# Patient Record
Sex: Male | Born: 1950 | Race: Black or African American | Hispanic: No | Marital: Married | Smoking: Never smoker
Health system: Southern US, Community
[De-identification: ages and names within clinical notes are randomized; demographics above are authoritative.]

## PROBLEM LIST (undated history)

## (undated) DIAGNOSIS — H3552 Pigmentary retinal dystrophy: Secondary | ICD-10-CM

## (undated) DIAGNOSIS — K429 Umbilical hernia without obstruction or gangrene: Secondary | ICD-10-CM

## (undated) DIAGNOSIS — G4733 Obstructive sleep apnea (adult) (pediatric): Secondary | ICD-10-CM

## (undated) DIAGNOSIS — K635 Polyp of colon: Secondary | ICD-10-CM

## (undated) DIAGNOSIS — K219 Gastro-esophageal reflux disease without esophagitis: Secondary | ICD-10-CM

## (undated) DIAGNOSIS — G4730 Sleep apnea, unspecified: Secondary | ICD-10-CM

## (undated) DIAGNOSIS — I209 Angina pectoris, unspecified: Secondary | ICD-10-CM

## (undated) DIAGNOSIS — E785 Hyperlipidemia, unspecified: Secondary | ICD-10-CM

## (undated) DIAGNOSIS — I739 Peripheral vascular disease, unspecified: Secondary | ICD-10-CM

## (undated) DIAGNOSIS — I10 Essential (primary) hypertension: Secondary | ICD-10-CM

## (undated) DIAGNOSIS — I714 Abdominal aortic aneurysm, without rupture, unspecified: Secondary | ICD-10-CM

## (undated) HISTORY — DX: Hyperlipidemia, unspecified: E78.5

## (undated) HISTORY — PX: KNEE SURGERY: SHX244

## (undated) HISTORY — PX: COLONOSCOPY: SHX174

## (undated) HISTORY — PX: OTHER SURGICAL HISTORY: SHX169

## (undated) HISTORY — PX: ACHILLES TENDON SURGERY: SHX542

## (undated) HISTORY — PX: HERNIA REPAIR: SHX51

## (undated) HISTORY — PX: EYE SURGERY: SHX253

---

## 1995-06-27 DIAGNOSIS — H3552 Pigmentary retinal dystrophy: Secondary | ICD-10-CM

## 1995-06-27 HISTORY — DX: Pigmentary retinal dystrophy: H35.52

## 2012-09-17 DIAGNOSIS — H269 Unspecified cataract: Secondary | ICD-10-CM | POA: Insufficient documentation

## 2013-09-29 ENCOUNTER — Emergency Department: Payer: Self-pay | Admitting: Emergency Medicine

## 2013-09-29 LAB — COMPREHENSIVE METABOLIC PANEL
ALK PHOS: 104 U/L
ALT: 34 U/L (ref 12–78)
Albumin: 3.7 g/dL (ref 3.4–5.0)
Anion Gap: 5 — ABNORMAL LOW (ref 7–16)
BUN: 15 mg/dL (ref 7–18)
Bilirubin,Total: 0.5 mg/dL (ref 0.2–1.0)
Calcium, Total: 8.1 mg/dL — ABNORMAL LOW (ref 8.5–10.1)
Chloride: 102 mmol/L (ref 98–107)
Co2: 30 mmol/L (ref 21–32)
Creatinine: 2.21 mg/dL — ABNORMAL HIGH (ref 0.60–1.30)
EGFR (African American): 35 — ABNORMAL LOW
EGFR (Non-African Amer.): 30 — ABNORMAL LOW
GLUCOSE: 108 mg/dL — AB (ref 65–99)
Osmolality: 275 (ref 275–301)
POTASSIUM: 3.7 mmol/L (ref 3.5–5.1)
SGOT(AST): 41 U/L — ABNORMAL HIGH (ref 15–37)
Sodium: 137 mmol/L (ref 136–145)
Total Protein: 7.7 g/dL (ref 6.4–8.2)

## 2013-09-29 LAB — CBC
HCT: 43.7 % (ref 40.0–52.0)
HGB: 14.4 g/dL (ref 13.0–18.0)
MCH: 31 pg (ref 26.0–34.0)
MCHC: 32.8 g/dL (ref 32.0–36.0)
MCV: 95 fL (ref 80–100)
Platelet: 175 10*3/uL (ref 150–440)
RBC: 4.63 10*6/uL (ref 4.40–5.90)
RDW: 13.5 % (ref 11.5–14.5)
WBC: 4.2 10*3/uL (ref 3.8–10.6)

## 2013-09-29 LAB — URINALYSIS, COMPLETE
BACTERIA: NONE SEEN
BLOOD: NEGATIVE
Bilirubin,UR: NEGATIVE
Glucose,UR: NEGATIVE mg/dL (ref 0–75)
Ketone: NEGATIVE
LEUKOCYTE ESTERASE: NEGATIVE
Nitrite: NEGATIVE
Ph: 5 (ref 4.5–8.0)
Protein: 30
RBC,UR: NONE SEEN /HPF (ref 0–5)
SPECIFIC GRAVITY: 1.028 (ref 1.003–1.030)
Squamous Epithelial: <1

## 2013-09-29 LAB — LIPASE, BLOOD: Lipase: 86 U/L (ref 73–393)

## 2013-09-29 LAB — MAGNESIUM: Magnesium: 1.8 mg/dL

## 2014-04-26 DIAGNOSIS — I10 Essential (primary) hypertension: Secondary | ICD-10-CM | POA: Insufficient documentation

## 2014-05-14 DIAGNOSIS — E7800 Pure hypercholesterolemia, unspecified: Secondary | ICD-10-CM | POA: Insufficient documentation

## 2016-03-21 DIAGNOSIS — Z0000 Encounter for general adult medical examination without abnormal findings: Secondary | ICD-10-CM | POA: Insufficient documentation

## 2016-03-28 ENCOUNTER — Encounter: Payer: Self-pay | Admitting: Internal Medicine

## 2016-03-28 ENCOUNTER — Ambulatory Visit (INDEPENDENT_AMBULATORY_CARE_PROVIDER_SITE_OTHER): Payer: Medicare Other | Admitting: Internal Medicine

## 2016-03-28 VITALS — BP 145/94 | HR 58 | Ht 76.0 in | Wt 223.0 lb

## 2016-03-28 DIAGNOSIS — R55 Syncope and collapse: Secondary | ICD-10-CM | POA: Diagnosis not present

## 2016-03-28 NOTE — Progress Notes (Signed)
HPI Joe Pena is referred by Dr. Ouida Sills for evaluation of syncope. He is a pleasant otherwise healthy 65 yo man with dyslipidemia. He presented initially with syncope several weeks ago. He notes that the spell occurred after he had been outside in the heat for about 4 hours. He had not eaten that day or had anything to drink. The patient began to feel bad and passed out. No loss of bowel or bladder function. No tongue biting. His initial blood pressure was low and he was diaphoretic after the episode. His blood pressure was in the 80's. He felt bad for a day after. The next episode occurred last week. He was visiting a friend and had been working outside and again passed out. His friend caught him. He was out for about 45 seconds. Again he was nauseated, diaphoretic and evantually felt better. His blood pressure was 74/39. He has had no additional episodes.  Not on File   Current Outpatient Prescriptions  Medication Sig Dispense Refill  . atorvastatin (LIPITOR) 40 MG tablet Take 1 tablet by mouth daily.    . fluticasone (FLONASE) 50 MCG/ACT nasal spray Place 1-2 sprays into the nose as needed for allergies.     No current facility-administered medications for this visit.      No past medical history on file.  ROS:   All systems reviewed and negative except as noted in the HPI.   No past surgical history on file.   Family History  Problem Relation Age of Onset  . Heart disease Mother   . Hyperlipidemia Father      Social History   Social History  . Marital status: Married    Spouse name: N/A  . Number of children: N/A  . Years of education: N/A   Occupational History  . Not on file.   Social History Main Topics  . Smoking status: Never Smoker  . Smokeless tobacco: Never Used  . Alcohol use Yes     Comment: 2 times weekly  . Drug use: No  . Sexual activity: Not on file   Other Topics Concern  . Not on file   Social History Narrative  . No narrative on  file     BP (!) 145/94   Pulse (!) 58   Ht 6\' 4"  (1.93 m)   Wt 223 lb (101.2 kg)   SpO2 97%   BMI 27.14 kg/m   Physical Exam:  Well appearing NAD HEENT: Unremarkable Neck:  No JVD, no thyromegally Lymphatics:  No adenopathy Back:  No CVA tenderness Lungs:  Clear with no wheezes HEART:  Regular rate rhythm, no murmurs, no rubs, no clicks Abd:  soft, positive bowel sounds, no organomegally, no rebound, no guarding Ext:  2 plus pulses, no edema, no cyanosis, no clubbing Skin:  No rashes no nodules Neuro:  CN II through XII intact, motor grossly intact  EKG - nsr  Orthostatic vitals - no orthostasis.   Assess/Plan: 1. Syncope - his echo shows normal LV function and his symptoms consistent with autonomic dysfunction. I have discussed the treatment options with the patient. He is encouraged to increase his salt and fluid intake and to avoid caffeine and overheating.  I spent 45 minutes with the patient and his wife discussing the treatment options. He is enocuraged to avoid dehydration, caffeine, missing meals and I have asked him to eat more salt. I will see him back in several weeks. He is encouraged to lie down when he  feels an episode beginning to occur.  Joe Overlie Tredarius Cobern,MD

## 2016-03-28 NOTE — Patient Instructions (Addendum)
Medication Instructions:  Your physician recommends that you continue on your current medications as directed. Please refer to the Current Medication list given to you today.   Labwork: None ordered   Testing/Procedures: None ordered   Follow-Up:  Your physician recommends that you schedule a follow-up appointment in: 3 months with Dr Lovena Le   Any Other Special Instructions Will Be Listed Below (If Applicable).   Increase Salt and fluid in diet

## 2016-03-30 NOTE — Addendum Note (Signed)
Addended by: Campbell Riches on: 03/30/2016 09:40 AM   Modules accepted: Orders

## 2016-05-25 ENCOUNTER — Other Ambulatory Visit: Payer: Self-pay | Admitting: Internal Medicine

## 2016-05-25 DIAGNOSIS — R1904 Left lower quadrant abdominal swelling, mass and lump: Secondary | ICD-10-CM

## 2016-06-06 ENCOUNTER — Ambulatory Visit
Admission: RE | Admit: 2016-06-06 | Discharge: 2016-06-06 | Disposition: A | Discharge status: Home / Self Care | Payer: Medicare Other | Source: Ambulatory Visit | Attending: Internal Medicine | Admitting: Internal Medicine

## 2016-06-06 ENCOUNTER — Encounter: Payer: Self-pay | Admitting: Vascular Surgery

## 2016-06-06 ENCOUNTER — Ambulatory Visit (INDEPENDENT_AMBULATORY_CARE_PROVIDER_SITE_OTHER): Payer: Medicare Other | Admitting: Vascular Surgery

## 2016-06-06 ENCOUNTER — Inpatient Hospital Stay
Admission: AD | Admit: 2016-06-06 | Discharge: 2016-06-08 | DRG: 269 | Disposition: A | Discharge status: Home / Self Care | Payer: Medicare Other | Source: Ambulatory Visit | Attending: Vascular Surgery | Admitting: Vascular Surgery

## 2016-06-06 ENCOUNTER — Inpatient Hospital Stay: Payer: Medicare Other

## 2016-06-06 ENCOUNTER — Encounter (INDEPENDENT_AMBULATORY_CARE_PROVIDER_SITE_OTHER): Payer: Self-pay | Admitting: Vascular Surgery

## 2016-06-06 VITALS — BP 146/100 | HR 73 | Resp 17 | Ht 76.0 in | Wt 224.0 lb

## 2016-06-06 DIAGNOSIS — Z9889 Other specified postprocedural states: Secondary | ICD-10-CM

## 2016-06-06 DIAGNOSIS — R1904 Left lower quadrant abdominal swelling, mass and lump: Secondary | ICD-10-CM | POA: Insufficient documentation

## 2016-06-06 DIAGNOSIS — Z79899 Other long term (current) drug therapy: Secondary | ICD-10-CM

## 2016-06-06 DIAGNOSIS — E785 Hyperlipidemia, unspecified: Secondary | ICD-10-CM | POA: Diagnosis present

## 2016-06-06 DIAGNOSIS — I10 Essential (primary) hypertension: Secondary | ICD-10-CM

## 2016-06-06 DIAGNOSIS — K429 Umbilical hernia without obstruction or gangrene: Secondary | ICD-10-CM

## 2016-06-06 DIAGNOSIS — I714 Abdominal aortic aneurysm, without rupture, unspecified: Secondary | ICD-10-CM | POA: Diagnosis present

## 2016-06-06 DIAGNOSIS — E7800 Pure hypercholesterolemia, unspecified: Secondary | ICD-10-CM | POA: Diagnosis not present

## 2016-06-06 DIAGNOSIS — Z95828 Presence of other vascular implants and grafts: Secondary | ICD-10-CM | POA: Insufficient documentation

## 2016-06-06 DIAGNOSIS — Z01818 Encounter for other preprocedural examination: Secondary | ICD-10-CM

## 2016-06-06 DIAGNOSIS — N281 Cyst of kidney, acquired: Secondary | ICD-10-CM | POA: Insufficient documentation

## 2016-06-06 HISTORY — DX: Pigmentary retinal dystrophy: H35.52

## 2016-06-06 HISTORY — DX: Abdominal aortic aneurysm, without rupture: I71.4

## 2016-06-06 HISTORY — DX: Abdominal aortic aneurysm, without rupture, unspecified: I71.40

## 2016-06-06 HISTORY — DX: Essential (primary) hypertension: I10

## 2016-06-06 LAB — BASIC METABOLIC PANEL
ANION GAP: 4 — AB (ref 5–15)
BUN: 15 mg/dL (ref 6–20)
CO2: 29 mmol/L (ref 22–32)
Calcium: 8.7 mg/dL — ABNORMAL LOW (ref 8.9–10.3)
Chloride: 104 mmol/L (ref 101–111)
Creatinine, Ser: 1.21 mg/dL (ref 0.61–1.24)
GFR calc Af Amer: >60 mL/min (ref >60)
GLUCOSE: 98 mg/dL (ref 65–99)
POTASSIUM: 3.8 mmol/L (ref 3.5–5.1)
Sodium: 137 mmol/L (ref 135–145)

## 2016-06-06 LAB — CBC WITH DIFFERENTIAL/PLATELET
BASOS ABS: 0 10*3/uL (ref 0–0.1)
Basophils Relative: 1 %
Eosinophils Absolute: 0.2 10*3/uL (ref 0–0.7)
Eosinophils Relative: 3 %
HEMATOCRIT: 40.2 % (ref 40.0–52.0)
HEMOGLOBIN: 13.9 g/dL (ref 13.0–18.0)
LYMPHS PCT: 26 %
Lymphs Abs: 1.5 10*3/uL (ref 1.0–3.6)
MCH: 31.7 pg (ref 26.0–34.0)
MCHC: 34.5 g/dL (ref 32.0–36.0)
MCV: 91.6 fL (ref 80.0–100.0)
MONO ABS: 0.5 10*3/uL (ref 0.2–1.0)
MONOS PCT: 8 %
NEUTROS ABS: 3.5 10*3/uL (ref 1.4–6.5)
Neutrophils Relative %: 62 %
Platelets: 191 10*3/uL (ref 150–440)
RBC: 4.39 MIL/uL — ABNORMAL LOW (ref 4.40–5.90)
RDW: 14 % (ref 11.5–14.5)
WBC: 5.6 10*3/uL (ref 3.8–10.6)

## 2016-06-06 LAB — APTT: aPTT: 30 seconds (ref 24–36)

## 2016-06-06 LAB — PROTIME-INR
INR: 1.05
Prothrombin Time: 13.7 seconds (ref 11.4–15.2)

## 2016-06-06 MED ORDER — SODIUM CHLORIDE 0.9 % IV SOLN
INTRAVENOUS | Status: DC
  Administered 2016-06-07: via INTRAVENOUS

## 2016-06-06 MED ORDER — ATORVASTATIN CALCIUM 20 MG PO TABS
40.0000 mg | ORAL_TABLET | Freq: Every day | ORAL | Status: DC
  Administered 2016-06-06: 40 mg via ORAL
  Filled 2016-06-06: qty 2

## 2016-06-06 MED ORDER — HEPARIN SODIUM (PORCINE) 5000 UNIT/ML IJ SOLN
5000.0000 [IU] | Freq: Three times a day (TID) | INTRAMUSCULAR | Status: DC
  Administered 2016-06-06 – 2016-06-07 (×2): 5000 [IU] via SUBCUTANEOUS
  Filled 2016-06-06 (×2): qty 1

## 2016-06-06 MED ORDER — IOPAMIDOL (ISOVUE-300) INJECTION 61%: 100.0000 mL | Freq: Once | INTRAVENOUS | Status: DC | PRN

## 2016-06-06 MED ORDER — IRBESARTAN 150 MG PO TABS
300.0000 mg | ORAL_TABLET | Freq: Every day | ORAL | Status: DC
  Administered 2016-06-06 – 2016-06-07 (×2): 300 mg via ORAL
  Filled 2016-06-06 (×3): qty 2

## 2016-06-06 MED ORDER — ACETAMINOPHEN 325 MG PO TABS: 650.0000 mg | ORAL_TABLET | Freq: Four times a day (QID) | ORAL | Status: DC | PRN

## 2016-06-06 MED ORDER — CHLORHEXIDINE GLUCONATE CLOTH 2 % EX PADS
6.0000 | MEDICATED_PAD | Freq: Once | CUTANEOUS | Status: AC
  Administered 2016-06-06: 6 via TOPICAL

## 2016-06-06 MED ORDER — CEFAZOLIN SODIUM-DEXTROSE 2-4 GM/100ML-% IV SOLN
2.0000 g | INTRAVENOUS | Status: DC
  Filled 2016-06-06: qty 100

## 2016-06-06 MED ORDER — CHLORHEXIDINE GLUCONATE CLOTH 2 % EX PADS
6.0000 | MEDICATED_PAD | Freq: Once | CUTANEOUS | Status: AC
  Administered 2016-06-07: 6 via TOPICAL

## 2016-06-06 MED ORDER — AMLODIPINE BESYLATE-VALSARTAN 10-320 MG PO TABS: 1.0000 | ORAL_TABLET | Freq: Every day | ORAL | Status: DC

## 2016-06-06 MED ORDER — AMLODIPINE BESYLATE 10 MG PO TABS
10.0000 mg | ORAL_TABLET | Freq: Every day | ORAL | Status: DC
  Administered 2016-06-06 – 2016-06-07 (×2): 10 mg via ORAL
  Filled 2016-06-06 (×3): qty 1

## 2016-06-06 NOTE — Progress Notes (Signed)
Patient ID: Joe Pena, male   DOB: [birth year], 65 y.o.   MRN: [id number]  Chief Complaint  Patient presents with  . New Patient (Initial Visit)    HPI Joe Pena is a 65 y.o. male.  I am asked to see the patient by Dr. Ouida Sills for evaluation of a large abdominal aortic aneurysm.  The patient reports a pulsatile abdominal mass over the past few months with some vague left flank and lower abdominal pain. He denies any signs of peripheral embolization. He denies any back pain. He has a family history of aneurysm with a mother who had an aneurysm some years ago. He had no previous knowledge of aneurysm personally or diagnosis of an aneurysm. Secondary to his symptoms, he was sent for a CT scan of the abdomen and pelvis today which I have independently reviewed. This demonstrates a very large infrarenal abdominal aortic aneurysm measuring 8 cm in maximal diameter. There is a very large flow lumen. There is no evidence of rupture.  Past Medical History:  Diagnosis Date  . AAA (abdominal aortic aneurysm) ([facility])   . Hyperlipidemia   . Hypertension   . 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 HERNIA REPAIR    . KNEE SURGERY Right     Family History  Problem Relation Age of Onset  . Heart disease Mother   . Aortic aneurysm Mother   . Hyperlipidemia Father   No bleeding or clotting disorders  Social History Social History  Substance Use Topics  . Smoking status: Never Smoker  . Smokeless tobacco: Never Used  . Alcohol use Yes     Comment: 2 times weekly  Married for over 40 years and wife accompanies him today  Allergies  Allergen Reactions  . Pollen Extract Shortness Of Breath and Itching   Medications at home include Tylenol, Exforge, Lipitor, Avapro, Norvasc and Flonase No current facility-administered medications for this visit.    No current outpatient prescriptions on file.   Facility-Administered Medications Ordered in  Other Visits  Medication Dose Route Frequency Provider Last Rate Last Dose  . [START ON 06/07/2016] 0.9 %  sodium chloride infusion   Intravenous Continuous Kimberly A Stegmayer, PA-C      . acetaminophen (TYLENOL) tablet 650 mg  650 mg Oral Q6H PRN Kimberly A Stegmayer, PA-C      . amLODipine (NORVASC) tablet 10 mg  10 mg Oral Daily Kimberly A Stegmayer, PA-C       And  . irbesartan (AVAPRO) tablet 300 mg  300 mg Oral Daily Kimberly A Stegmayer, PA-C      . atorvastatin (LIPITOR) tablet 40 mg  40 mg Oral Daily Kimberly A Stegmayer, PA-C      . ceFAZolin (ANCEF) IVPB 2g/100 mL premix  2 g Intravenous On Call to [place], PA-C      . Chlorhexidine Gluconate Cloth 2 % PADS 6 each  6 each Topical Once [employer], PA-C       And  . Chlorhexidine Gluconate Cloth 2 % PADS 6 each  6 each Topical Once [employer], PA-C      . heparin injection 5,000 Units  5,000 Units Subcutaneous Q8H Kimberly A Stegmayer, PA-C      . iopamidol (ISOVUE-300) 61 % injection 100 mL  100 mL Intravenous Once PRN Kirk Ruths, MD          REVIEW OF SYSTEMS (Negative unless checked)  Constitutional: []   Weight loss  [] Fever  [] Chills Cardiac: [] Chest pain   [] Chest pressure   [] Palpitations   [] Shortness of breath when laying flat   [] Shortness of breath at rest   [] Shortness of breath with exertion. Vascular:  [] Pain in legs with walking   [] Pain in legs at rest   [] Pain in legs when laying flat   [] Claudication   [] Pain in feet when walking  [] Pain in feet at rest  [] Pain in feet when laying flat   [] History of DVT   [] Phlebitis   [] Swelling in legs   [] Varicose veins   [] Non-healing ulcers Pulmonary:   [] Uses home oxygen   [] Productive cough   [] Hemoptysis   [] Wheeze  [] COPD   [] Asthma Neurologic:  [] Dizziness  [x] Blackouts   [] Seizures   [] History of stroke   [] History of TIA  [] Aphasia   [] Temporary blindness   [] Dysphagia   [] Weakness or numbness in arms   [] Weakness or numbness  in legs Musculoskeletal:  [] Arthritis   [] Joint swelling   [] Joint pain   [] Low back pain Hematologic:  [] Easy bruising  [] Easy bleeding   [] Hypercoagulable state   [] Anemic  [] Hepatitis Gastrointestinal:  [] Blood in stool   [] Vomiting blood  [] Gastroesophageal reflux/heartburn   [] Abdominal pain Genitourinary:  [] Chronic kidney disease   [] Difficult urination  [] Frequent urination  [] Burning with urination   [] Hematuria Skin:  [] Rashes   [] Ulcers   [] Wounds Psychological:  [] History of anxiety   []  History of major depression.    Physical Exam BP (!) 146/100   Pulse 73   Resp 17   Ht 6\' 4"  (1.93 m)   Wt 101.6 kg (224 lb)   BMI 27.27 kg/m  Gen:  WD/WN, NAD. Ears younger than stated age  Head: [place]/AT, No temporalis wasting. Prominent temp pulse not noted. Ear/Nose/Throat: Hearing grossly intact, nares w/o erythema or drainage, oropharynx w/o Erythema/Exudate Eyes: Conjunctiva clear, sclera non-icteric  Neck: trachea midline.  No bruit or JVD.  Pulmonary:  Good air movement, clear to auscultation bilaterally.  Cardiac: RRR, normal S1, S2, no Murmurs, rubs or gallops. Vascular:  Vessel Right Left  Radial Palpable Palpable  Ulnar Palpable Palpable  Brachial Palpable Palpable  Carotid Palpable, without bruit Palpable, without bruit  Aorta Enlarged,  palpable N/A  Femoral Palpable Palpable  Popliteal Palpable Palpable  PT Palpable Palpable  DP Palpable Palpable   Gastrointestinal: soft, non-tender/non-distended. No guarding/reflex. No masses, surgical incisions, or scars. Musculoskeletal: M/S 5/5 throughout.  Extremities without ischemic changes.  No deformity or atrophy. No edema. Neurologic: Sensation grossly intact in extremities.  Symmetrical.  Speech is fluent. Motor exam as listed above. Psychiatric: Judgment intact, Mood & affect appropriate for pt's clinical situation. Dermatologic: No rashes or ulcers noted.  No cellulitis or open wounds. Lymph : No Cervical, Axillary, or  Inguinal lymphadenopathy.   Radiology Ct Abdomen Pelvis W Contrast  Result Date: 06/06/2016 CLINICAL DATA:  LEFT lower quadrant mass/not. Pulsatile feeling in stomach for 2-3 months. Prior inguinal hernia repair . EXAM: CT ABDOMEN AND PELVIS WITH CONTRAST TECHNIQUE: Multidetector CT imaging of the abdomen and pelvis was performed using the standard protocol following bolus administration of intravenous contrast. CONTRAST:  100 mL Isovue COMPARISON:  None. FINDINGS: Lower chest: Lung bases are clear. Hepatobiliary: The biliary duct dilatation. Low-density cysts in the RIGHT hepatic lobe. Gallbladder normal. Pancreas: Pancreas is normal. No ductal dilatation. No pancreatic inflammation. Spleen: Normal spleen Adrenals/urinary tract: Simple fluid attenuation cyst the pole of the RIGHT kidney. No renal obstruction. Ureters  bladder normal. Stomach/Bowel: Stomach, small bowel, appendix, and cecum are normal. The colon and rectosigmoid colon are normal. Vascular/Lymphatic: There is fusiform aneurysmal dilatation of the infrarenal abdominal aorta. Aneurysm measures 8 cm in AP dimension and extends over approximately 12 cm to the level of bifurcation. There is no evidence of acute rupture or inflammation of the aorta wall. Iliac arteries demonstrate mild intimal calcification and are non aneurysmal. Reproductive: Prostate normal Other: LEFT inguinal hernia repair with mesh. Small umbilical hernia with 13 mm mouth and 27 mm sac. Musculoskeletal: No aggressive osseous lesion. IMPRESSION: 1. Large fusiform infrarenal abdominal aortic aneurysm measuring up to 8 cm in AP dimension. No evidence of immediate impending rupture or inflammation. Patient was examined and is asymptomatic. Prompt Vascular surgery consultation recommended due to increased risk of rupture for AAA >5.5 cm. This recommendation follows ACR consensus guidelines: White Paper of the ACR Incidental Findings Committee II on Vascular Findings. J Am Coll  Radiol 2013; 10:789-794. Findings conveyed toMARSHALL ANDERSON on 06/06/2016  at8:45. 2.  LEFT inguinal hernia repair and small umbilical hernia. Electronically Signed   By: Suzy Bouchard M.D.   On: 06/06/2016 09:18    Labs No results found for this or any previous visit (from the past 2160 hour(s)).  Assessment/Plan:  Pure hypercholesterolemia lipid control important in reducing the progression of atherosclerotic disease. Continue statin therapy   Essential hypertension blood pressure control important in reducing the progression of atherosclerotic disease. On appropriate oral medications. Discussed the blood pressure control is very important and avoiding aneurysm progression, and avoidance of major blood pressure with a known abdominal aortic aneurysm is important.   AAA (abdominal aortic aneurysm) without rupture [facility]) The patient has an extremely large abdominal aortic aneurysm. CT scan of the abdomen and pelvis today which I have independently reviewed. This demonstrates a very large infrarenal abdominal aortic aneurysm measuring 8 cm in maximal diameter. There is a very large flow lumen. There is no evidence of rupture. He does appear to have anatomy that we'll allow endovascular repair. We had a long discussion today regarding abdominal aortic aneurysms. Clearly one of this size is a major, pressing problem and should be fixed soon. I have recommended admission to the hospital for appropriate preoperative testing and performance of his aneurysm repair tomorrow. I have discussed the risks and benefits of surgical repair and endovascular repair. He prefers and endovascular repair which I would agree with. He and his wife are agreeable with our plan of care.      Leotis Pain 06/06/2016, 11:49 AM   This note was created with Dragon medical transcription system.  Any errors from dictation are unintentional.

## 2016-06-06 NOTE — Patient Instructions (Signed)
Abdominal Aortic Aneurysm Endograft Repair Abdominal aortic aneurysm endograft repair is a surgery to fix an aortic aneurysm in the abdominal area. An aneurysm is a weak or damaged part of an artery wall that bulges out from the normal force of blood pumping through the body. An abdominal aortic aneurysm is an aneurysm that happens in the lower part of the aorta, which is the main artery of the body. The repair is often done if the aneurysm gets so large that it might burst (rupture). A ruptured aneurysm would cause bleeding inside the body that could put a person's life in danger. Before that happens, this procedure is needed to fix the problem. The procedure may also be done if the aneurysm causes symptoms such as pain in the back, abdomen, or side. In this procedure, a tube made of fabric and metal mesh (endograft or stent-graft) is placed in the weak part of the aorta to repair it. Tell a health care provider about:  Any allergies you have.  All medicines you are taking, including vitamins, herbs, eye drops, creams, and over-the-counter medicines.  Any problems you or family members have had with anesthetic medicines.  Any blood disorders you have.  Any surgeries you have had.  Any medical conditions you have.  Whether you are pregnant or may be pregnant. What are the risks? Generally, this is a safe procedure. However, problems may occur, including:  Infection of the graft or incision area.  Bleeding during the procedure or from the incision site.  Allergic reactions to medicines.  Damage to other structures or organs.  Blood leaking out around the endograft.  The endograft moving from where it was placed during surgery.  Blood flow through the graft becoming blocked.  Blood clots.  Kidney problems.  Blood flow to the legs becoming blocked (rare).  Rupture of the aorta even after the endograft repair is a success (rare). What happens before the procedure? Staying  hydrated  Follow instructions from your health care provider about hydration, which may include:  Up to 2 hours before the procedure - you may continue to drink clear liquids, such as water, clear fruit juice, black coffee, and plain tea. Eating and drinking restrictions  Follow instructions from your health care provider about eating and drinking, which may include:  8 hours before the procedure - stop eating heavy meals or foods such as meat, fried foods, or fatty foods.  6 hours before the procedure - stop eating light meals or foods, such as toast or cereal.  6 hours before the procedure - stop drinking milk or drinks that contain milk.  2 hours before the procedure - stop drinking clear liquids. Medicines  Ask your health care provider about:  Changing or stopping your regular medicines. This is especially important if you are taking diabetes medicines or blood thinners.  Taking medicines such as aspirin and ibuprofen. These medicines can thin your blood. Do not take these medicines before your procedure if your health care provider instructs you not to.  You may be given antibiotic medicine to help prevent infection. General instructions  You may need to have blood tests, a test to check heart rhythm (electrocardiogram, or ECG), or a test to check blood flow (angiogram) before the surgery.  Imaging tests will be done to check the size and location of the aneurysm. These tests could include an ultrasound, a CT scan, or an MRI.  Do not use any products that contain nicotine or tobacco-such as cigarettes and e-cigarettes-for   as long as possible before the surgery. If you need help quitting, ask your health care provider.  Ask your health care provider how your surgical site will be marked or identified.  Plan to have someone take you home from the hospital or clinic. What happens during the procedure?  To reduce your risk of infection:  Your health care team will wash or  sanitize their hands.  Your skin will be washed with soap.  Hair may be removed from the surgical area.  An IV tube will be inserted into one of your veins.  You will be given one or more of the following:  A medicine to help you relax (sedative).  A medicine to numb the area (local anesthetic).  A medicine to make you fall asleep (general anesthetic).  A medicine that is injected into an area of your body to numb everything below the injection site (regional anesthetic).  During the surgery:  Small incisions or a puncture will be made on one or both sides of the groin. Long, thin tubes (catheters) will be passed through the opening, put into the artery in your thigh, and moved up into the aneurysm in the aorta.  The health care provider will use live X-ray pictures to guide the endograft through the catheterto the place where the aneurysm is.  The endograft will be released to seal off the aneurysm and to line the aorta. It will keep blood from flowing into the aneurysm and will help keep it from rupturing. The endograft will stay in place and will not be taken out.  X-rays will be used to check where the endograft is placed and to make sure that it is where it should be.  The catheter will be taken out, and the incision will be closed with stitches (sutures). The procedure may vary among health care providers and hospitals. What happens after the procedure?  Your blood pressure, heart rate, breathing rate, and blood oxygen level will be monitored until the medicines you were given have worn off.  You will need to lie flat for a number of hours. Bending your legs can cause them to bleed and swell.  You will then be urged to get up and move around a number of times each day and to slowly become more active.  You will be given medicines to control pain.  Certain tests may be done after your procedure to check how well the endograft is working and to check its placement.  Do  not drive for 24 hours if you received a sedative. This information is not intended to replace advice given to you by your health care provider. Make sure you discuss any questions you have with your health care provider. Document Released: 10/29/2008 Document Revised: 12/31/2015 Document Reviewed: 09/06/2015 Elsevier Interactive Patient Education  2017 [employer]  

## 2016-06-06 NOTE — Assessment & Plan Note (Signed)
blood pressure control important in reducing the progression of atherosclerotic disease. On appropriate oral medications. Discussed the blood pressure control is very important and avoiding aneurysm progression, and avoidance of major blood pressure with a known abdominal aortic aneurysm is important.

## 2016-06-06 NOTE — Assessment & Plan Note (Signed)
The patient has an extremely large abdominal aortic aneurysm. CT scan of the abdomen and pelvis today which I have independently reviewed. This demonstrates a very large infrarenal abdominal aortic aneurysm measuring 8 cm in maximal diameter. There is a very large flow lumen. There is no evidence of rupture. He does appear to have anatomy that we'll allow endovascular repair. We had a long discussion today regarding abdominal aortic aneurysms. Clearly one of this size is a major, pressing problem and should be fixed soon. I have recommended admission to the hospital for appropriate preoperative testing and performance of his aneurysm repair tomorrow. I have discussed the risks and benefits of surgical repair and endovascular repair. He prefers and endovascular repair which I would agree with. He and his wife are agreeable with our plan of care.

## 2016-06-06 NOTE — Assessment & Plan Note (Signed)
lipid control important in reducing the progression of atherosclerotic disease. Continue statin therapy  

## 2016-06-07 ENCOUNTER — Inpatient Hospital Stay: Admission: RE | Admit: 2016-06-07 | Payer: Medicare Other | Source: Ambulatory Visit | Admitting: Vascular Surgery

## 2016-06-07 ENCOUNTER — Encounter: Payer: Self-pay | Admitting: Anesthesiology

## 2016-06-07 ENCOUNTER — Inpatient Hospital Stay: Payer: Medicare Other | Admitting: Registered Nurse

## 2016-06-07 ENCOUNTER — Encounter
Admission: AD | Disposition: A | Discharge status: Home / Self Care | Payer: Self-pay | Source: Ambulatory Visit | Attending: Vascular Surgery

## 2016-06-07 DIAGNOSIS — I714 Abdominal aortic aneurysm, without rupture: Secondary | ICD-10-CM

## 2016-06-07 HISTORY — PX: ENDOVASCULAR REPAIR/STENT GRAFT: CATH118280

## 2016-06-07 HISTORY — PX: PERIPHERAL VASCULAR CATHETERIZATION: SHX172C

## 2016-06-07 LAB — SURGICAL PCR SCREEN
MRSA, PCR: NEGATIVE
STAPHYLOCOCCUS AUREUS: NEGATIVE

## 2016-06-07 LAB — ABO/RH: ABO/RH(D): O POS

## 2016-06-07 SURGERY — ENDOVASCULAR REPAIR/STENT GRAFT
Anesthesia: General

## 2016-06-07 MED ORDER — PHENYLEPHRINE HCL 10 MG/ML IJ SOLN
INTRAMUSCULAR | Status: DC | PRN
  Administered 2016-06-07: 30 ug/min via INTRAVENOUS

## 2016-06-07 MED ORDER — ONDANSETRON HCL 4 MG/2ML IJ SOLN
INTRAMUSCULAR | Status: DC | PRN
  Administered 2016-06-07: 4 mg via INTRAVENOUS

## 2016-06-07 MED ORDER — ONDANSETRON HCL 4 MG/2ML IJ SOLN: 4.0000 mg | Freq: Once | INTRAMUSCULAR | Status: DC | PRN

## 2016-06-07 MED ORDER — CENTRUM SILVER PO TABS: ORAL_TABLET | Freq: Every day | ORAL | Status: DC

## 2016-06-07 MED ORDER — LIDOCAINE HCL (CARDIAC) 20 MG/ML IV SOLN
INTRAVENOUS | Status: DC | PRN
  Administered 2016-06-07: 50 mg via INTRAVENOUS

## 2016-06-07 MED ORDER — KETAMINE HCL 50 MG/ML IJ SOLN
INTRAMUSCULAR | Status: DC | PRN
  Administered 2016-06-07: 25 mg via INTRAMUSCULAR
  Administered 2016-06-07: 25 mg via INTRAVENOUS

## 2016-06-07 MED ORDER — MORPHINE SULFATE (PF) 4 MG/ML IV SOLN
2.0000 mg | INTRAVENOUS | Status: DC | PRN
  Administered 2016-06-07: 2 mg via INTRAVENOUS
  Filled 2016-06-07: qty 1

## 2016-06-07 MED ORDER — ASPIRIN 81 MG PO CHEW
81.0000 mg | CHEWABLE_TABLET | Freq: Every day | ORAL | Status: DC
  Administered 2016-06-07 – 2016-06-08 (×2): 81 mg via ORAL
  Filled 2016-06-07 (×2): qty 1

## 2016-06-07 MED ORDER — FENTANYL CITRATE (PF) 100 MCG/2ML IJ SOLN
25.0000 ug | INTRAMUSCULAR | Status: DC | PRN
  Administered 2016-06-07 (×2): 50 ug via INTRAVENOUS

## 2016-06-07 MED ORDER — SUGAMMADEX SODIUM 200 MG/2ML IV SOLN
INTRAVENOUS | Status: DC | PRN
  Administered 2016-06-07: 220 mg via INTRAVENOUS

## 2016-06-07 MED ORDER — HYDROCODONE-ACETAMINOPHEN 5-325 MG PO TABS
1.0000 | ORAL_TABLET | Freq: Four times a day (QID) | ORAL | Status: DC | PRN
  Administered 2016-06-07: 1 via ORAL
  Administered 2016-06-08: 2 via ORAL
  Filled 2016-06-07: qty 1
  Filled 2016-06-07: qty 2

## 2016-06-07 MED ORDER — PROPOFOL 10 MG/ML IV BOLUS
INTRAVENOUS | Status: DC | PRN
  Administered 2016-06-07: 200 mg via INTRAVENOUS

## 2016-06-07 MED ORDER — SODIUM CHLORIDE 0.9 % IV SOLN
INTRAVENOUS | Status: DC | PRN
  Administered 2016-06-07: 08:00:00 via INTRAVENOUS

## 2016-06-07 MED ORDER — CEFAZOLIN SODIUM-DEXTROSE 2-3 GM-% IV SOLR
INTRAVENOUS | Status: DC | PRN
  Administered 2016-06-07: 2 g via INTRAVENOUS

## 2016-06-07 MED ORDER — ADULT MULTIVITAMIN W/MINERALS CH
1.0000 | ORAL_TABLET | Freq: Every day | ORAL | Status: DC
  Administered 2016-06-07 – 2016-06-08 (×2): 1 via ORAL
  Filled 2016-06-07 (×2): qty 1

## 2016-06-07 MED ORDER — DEXAMETHASONE SODIUM PHOSPHATE 10 MG/ML IJ SOLN
INTRAMUSCULAR | Status: DC | PRN
  Administered 2016-06-07: 4 mg via INTRAVENOUS

## 2016-06-07 MED ORDER — HEPARIN SODIUM (PORCINE) 1000 UNIT/ML IJ SOLN
INTRAMUSCULAR | Status: DC | PRN
  Administered 2016-06-07: 6000 [IU] via INTRAVENOUS

## 2016-06-07 MED ORDER — GLYCOPYRROLATE 0.2 MG/ML IJ SOLN
INTRAMUSCULAR | Status: DC | PRN
  Administered 2016-06-07: 0.2 mg via INTRAVENOUS

## 2016-06-07 MED ORDER — CEFAZOLIN IN D5W 1 GM/50ML IV SOLN
1.0000 g | Freq: Four times a day (QID) | INTRAVENOUS | Status: AC
  Administered 2016-06-07 – 2016-06-08 (×3): 1 g via INTRAVENOUS
  Filled 2016-06-07 (×3): qty 50

## 2016-06-07 MED ORDER — MIDAZOLAM HCL 2 MG/2ML IJ SOLN
INTRAMUSCULAR | Status: DC | PRN
  Administered 2016-06-07: 2 mg via INTRAVENOUS

## 2016-06-07 MED ORDER — FENTANYL CITRATE (PF) 100 MCG/2ML IJ SOLN
INTRAMUSCULAR | Status: DC | PRN
  Administered 2016-06-07: 100 ug via INTRAVENOUS

## 2016-06-07 MED ORDER — EPHEDRINE SULFATE 50 MG/ML IJ SOLN
INTRAMUSCULAR | Status: DC | PRN
  Administered 2016-06-07: 10 mg via INTRAVENOUS
  Administered 2016-06-07: 5 mg via INTRAVENOUS
  Administered 2016-06-07: 10 mg via INTRAVENOUS

## 2016-06-07 MED ORDER — HEPARIN (PORCINE) IN NACL 2-0.9 UNIT/ML-% IJ SOLN
INTRAMUSCULAR | Status: AC
  Filled 2016-06-07: qty 1500

## 2016-06-07 MED ORDER — PHENYLEPHRINE HCL 10 MG/ML IJ SOLN
INTRAMUSCULAR | Status: DC | PRN
  Administered 2016-06-07 (×3): 100 ug via INTRAVENOUS
  Administered 2016-06-07: 200 ug via INTRAVENOUS
  Administered 2016-06-07 (×2): 100 ug via INTRAVENOUS

## 2016-06-07 MED ORDER — ROCURONIUM BROMIDE 100 MG/10ML IV SOLN
INTRAVENOUS | Status: DC | PRN
  Administered 2016-06-07: 10 mg via INTRAVENOUS
  Administered 2016-06-07: 40 mg via INTRAVENOUS

## 2016-06-07 MED ORDER — SUCCINYLCHOLINE CHLORIDE 20 MG/ML IJ SOLN
INTRAMUSCULAR | Status: DC | PRN
  Administered 2016-06-07: 100 mg via INTRAVENOUS

## 2016-06-07 SURGICAL SUPPLY — 59 items
BLADE SURG 15 STRL LF DISP TIS (BLADE) ×1 IMPLANT
BLADE SURG 15 STRL SS (BLADE) ×2
BLADE SURG SZ11 CARB STEEL (BLADE) ×3 IMPLANT
BOOT SUTURE AID YELLOW STND (SUTURE) ×3 IMPLANT
BRUSH SCRUB 4% CHG (MISCELLANEOUS) ×3 IMPLANT
CATH ACCU-VU SIZ PIG 5F 70CM (CATHETERS) ×3 IMPLANT
CATH BALLN CODA 9X100X32 (BALLOONS) ×3 IMPLANT
CATH BEACON 5.038 65CM KMP-01 (CATHETERS) ×3 IMPLANT
DEVICE CLOSURE PERCLS PRGLD 6F (VASCULAR PRODUCTS) ×6 IMPLANT
DEVICE PRESTO INFLATION (MISCELLANEOUS) ×3 IMPLANT
DEVICE TORQUE (MISCELLANEOUS) ×3 IMPLANT
DRYSEAL FLEXSHEATH 12FR 33CM (SHEATH) ×2
DRYSEAL FLEXSHEATH 18FR 33CM (SHEATH) ×2
ELECT CAUTERY BLADE 6.4 (BLADE) ×3 IMPLANT
ELECT REM PT RETURN 9FT ADLT (ELECTROSURGICAL) ×3
ELECTRODE REM PT RTRN 9FT ADLT (ELECTROSURGICAL) ×1 IMPLANT
EXCLUDER TNK LEG 31MX14X13 (Endovascular Graft) ×1 IMPLANT
EXCLUDER TRUNK LEG 31MX14X13 (Endovascular Graft) ×3 IMPLANT
GLIDEWIRE STIFF .35X180X3 HYDR (WIRE) ×3 IMPLANT
GLOVE BIO SURGEON STRL SZ7 (GLOVE) ×6 IMPLANT
GLOVE SURG SYN 8.0 (GLOVE) ×3 IMPLANT
GOWN STRL REUS W/ TWL LRG LVL3 (GOWN DISPOSABLE) ×1 IMPLANT
GOWN STRL REUS W/ TWL XL LVL3 (GOWN DISPOSABLE) ×2 IMPLANT
GOWN STRL REUS W/TWL LRG LVL3 (GOWN DISPOSABLE) ×2
GOWN STRL REUS W/TWL XL LVL3 (GOWN DISPOSABLE) ×4
GRAFT EXCLUDER LEG 16X12 (Endovascular Graft) ×3 IMPLANT
IV NS 1000ML (IV SOLUTION) ×2
IV NS 1000ML BAXH (IV SOLUTION) ×1 IMPLANT
LEG CONTRALATERAL 16X20X13.5 (Vascular Products) ×2 IMPLANT
LEG CONTRALATERAL 23X12 (Endovascular Graft) ×3 IMPLANT
LIQUID BAND (GAUZE/BANDAGES/DRESSINGS) ×3 IMPLANT
LOOP RED MAXI  1X406MM (MISCELLANEOUS) ×2
LOOP VESSEL MAXI 1X406 RED (MISCELLANEOUS) ×1 IMPLANT
LOOP VESSEL MINI 0.8X406 BLUE (MISCELLANEOUS) ×1 IMPLANT
LOOPS BLUE MINI 0.8X406MM (MISCELLANEOUS) ×2
NEEDLE ENTRY 21GA 7CM ECHOTIP (NEEDLE) ×3 IMPLANT
PACK ANGIOGRAPHY (CUSTOM PROCEDURE TRAY) ×3 IMPLANT
PACK BASIN MAJOR ARMC (MISCELLANEOUS) ×3 IMPLANT
PERCLOSE PROGLIDE 6F (VASCULAR PRODUCTS) ×18
SET INTRO CAPELLA COAXIAL (SET/KITS/TRAYS/PACK) ×3 IMPLANT
SHEATH BRITE TIP 6FRX11 (SHEATH) ×6 IMPLANT
SHEATH BRITE TIP 8FRX11 (SHEATH) ×6 IMPLANT
SHEATH DRYSEAL FLEX 12FR 33CM (SHEATH) ×1 IMPLANT
SHEATH DRYSEAL FLEX 18FR 33CM (SHEATH) ×1 IMPLANT
SPONGE XRAY 4X4 16PLY STRL (MISCELLANEOUS) ×9 IMPLANT
STENT GRAFT CONTRALAT 20X13.5 (Vascular Products) ×1 IMPLANT
SUT MNCRL 4-0 (SUTURE) ×2
SUT MNCRL 4-0 27XMFL (SUTURE) ×1
SUT PROLENE 6 0 BV (SUTURE) ×3 IMPLANT
SUT SILK 2 0 (SUTURE) ×2
SUT SILK 2-0 18XBRD TIE 12 (SUTURE) ×1 IMPLANT
SUT SILK 3 0 (SUTURE) ×2
SUT SILK 3-0 18XBRD TIE 12 (SUTURE) ×1 IMPLANT
SUT SILK 4 0 (SUTURE) ×2
SUT SILK 4-0 18XBRD TIE 12 (SUTURE) ×1 IMPLANT
SUTURE MNCRL 4-0 27XMF (SUTURE) ×1 IMPLANT
SYR 20CC LL (SYRINGE) ×3 IMPLANT
WIRE AMPLATZ SSTIFF .035X260CM (WIRE) ×6 IMPLANT
WIRE J 3MM .035X145CM (WIRE) ×6 IMPLANT

## 2016-06-07 NOTE — Anesthesia Procedure Notes (Signed)
Procedure Name: Intubation Performed by: Rolla Plate Pre-anesthesia Checklist: Patient identified, Patient being monitored, Timeout performed, Emergency Drugs available and Suction available Patient Re-evaluated:Patient Re-evaluated prior to inductionOxygen Delivery Method: Circle system utilized Preoxygenation: Pre-oxygenation with 100% oxygen Intubation Type: IV induction and Rapid sequence Ventilation: Mask ventilation without difficulty Laryngoscope Size: Mac and 4 Grade View: Grade II Tube type: Oral Tube size: 7.5 mm Number of attempts: 1 Airway Equipment and Method: Stylet Placement Confirmation: ETT inserted through vocal cords under direct vision,  positive ETCO2 and breath sounds checked- equal and bilateral Secured at: 23 cm Tube secured with: Tape Dental Injury: Teeth and Oropharynx as per pre-operative assessment

## 2016-06-07 NOTE — Anesthesia Preprocedure Evaluation (Signed)
Anesthesia Evaluation  Patient identified by MRN, date of birth, ID band Patient awake    Reviewed: Allergy & Precautions, H&P , NPO status , Patient's Chart, lab work & pertinent test results, reviewed documented beta blocker date and time   History of Anesthesia Complications Negative for: history of anesthetic complications  Airway Mallampati: II  TM Distance: >3 FB Neck ROM: full    Dental  (+) Implants, Teeth Intact   Pulmonary neg pulmonary ROS,    Pulmonary exam normal breath sounds clear to auscultation       Cardiovascular Exercise Tolerance: Good hypertension, (-) angina+ Peripheral Vascular Disease  (-) CAD, (-) Past MI, (-) Cardiac Stents and (-) CABG Normal cardiovascular exam(-) dysrhythmias (-) Valvular Problems/Murmurs Rhythm:regular Rate:Normal     Neuro/Psych negative neurological ROS  negative psych ROS   GI/Hepatic Neg liver ROS, GERD  ,  Endo/Other  negative endocrine ROS  Renal/GU negative Renal ROS  negative genitourinary   Musculoskeletal   Abdominal   Peds  Hematology negative hematology ROS (+)   Anesthesia Other Findings Past Medical History: No date: AAA (abdominal aortic aneurysm) ([facility]) No date: Hyperlipidemia No date: Hypertension 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   Reproductive/Obstetrics negative OB ROS                             Anesthesia Physical Anesthesia Plan  ASA: II  Anesthesia Plan: General   Post-op Pain Management:    Induction:   Airway Management Planned:   Additional Equipment:   Intra-op Plan:   Post-operative Plan:   Informed Consent: I have reviewed the patients History and Physical, chart, labs and discussed the procedure including the risks, benefits and alternatives for the proposed anesthesia with the patient or authorized representative who has indicated his/her understanding and acceptance.   Dental Advisory  Given  Plan Discussed with: Anesthesiologist, CRNA and Surgeon  Anesthesia Plan Comments:         Anesthesia Quick Evaluation

## 2016-06-07 NOTE — Progress Notes (Signed)
[facility]  Daily Progress Note   Subjective  - Day of Surgery  Resting quietly after surgery. Mild pain.  Objective Vitals:   06/07/16 1032 06/07/16 1045 06/07/16 1057 06/07/16 1136  BP:  113/77 115/78 116/72  Pulse: 70 72 77 68  Resp: 15 20 14 14   Temp:   97.2 F (36.2 C) 97.3 F (36.3 C)  TempSrc:    Oral  SpO2: 100% 98% 96% 100%  Weight:      Height:        Intake/Output Summary (Last 24 hours) at 06/07/16 1459 Last data filed at 06/07/16 1425  Gross per 24 hour  Intake             2335 ml  Output             2350 ml  Net              -15 ml    PULM  CTAB CV  RRR VASC  Access sites C/D/I  Laboratory CBC    Component Value Date/Time   WBC 5.6 06/06/2016 1356   HGB 13.9 06/06/2016 1356   HGB 14.4 09/29/2013 1814   HCT 40.2 06/06/2016 1356   HCT 43.7 09/29/2013 1814   PLT 191 06/06/2016 1356   PLT 175 09/29/2013 1814    BMET    Component Value Date/Time   NA 137 06/06/2016 1356   NA 137 09/29/2013 1814   K 3.8 06/06/2016 1356   K 3.7 09/29/2013 1814   CL 104 06/06/2016 1356   CL 102 09/29/2013 1814   CO2 29 06/06/2016 1356   CO2 30 09/29/2013 1814   GLUCOSE 98 06/06/2016 1356   GLUCOSE 108 (H) 09/29/2013 1814   BUN 15 06/06/2016 1356   BUN 15 09/29/2013 1814   CREATININE 1.21 06/06/2016 1356   CREATININE 2.21 (H) 09/29/2013 1814   CALCIUM 8.7 (L) 06/06/2016 1356   CALCIUM 8.1 (L) 09/29/2013 1814   GFRNONAA >60 06/06/2016 1356   GFRNONAA 30 (L) 09/29/2013 1814   GFRAA >60 06/06/2016 1356   GFRAA 35 (L) 09/29/2013 1814    Assessment/Planning: POD #0 s/p endovascular AAA repair   Doing well  Advance diet  Labs in the am    Leotis Pain  06/07/2016, 2:59 PM

## 2016-06-07 NOTE — Op Note (Signed)
OPERATIVE NOTE   PROCEDURE: 1. US guidance for vascular access, bilateral femoral arteries 2. Catheter placement into aorta from bilateral femoral approaches 3. Placement of a C3 31 x 14 x 13 Gore Excluder Endoprosthesis main body with a 20 x 14 contralateral limb 4. Placement of a 16 x 12 x 7 iliac extender on the left  5. Placement of a 23 x 12 iliac extender on the right  6. ProGlide closure devices bilateral femoral arteries  PRE-OPERATIVE DIAGNOSIS: AAA  POST-OPERATIVE DIAGNOSIS: same  SURGEON: Hortencia Pilar, MD and Leotis Pain, MD - Co-surgeons  ANESTHESIA: general  ESTIMATED BLOOD LOSS: 150 cc  FINDING(S): 1.  AAA  SPECIMEN(S):  none  INDICATIONS:   Joe Pena is a 65 y.o. y.o. male who presents with an 8 cm abdominal aortic aneurysm. He is undergoing endovascular repair to prevent lethal rupture.  DESCRIPTION: After obtaining full informed written consent, the patient was brought back to the operating room and placed supine upon the operating table.  The patient received IV antibiotics prior to induction.  After obtaining adequate anesthesia, the patient was prepped and draped in the standard fashion for endovascular AAA repair.  We then began by gaining access to both femoral arteries with US guidance with me working on the patient's left and Dr. Lucky Cowboy working on the patient's right.  The femoral arteries were found to be patent and accessed without difficulty with a needle under ultrasound guidance without difficulty on each side and permanent images were recorded.  We then placed 2 proglide devices on each side in a pre-close fashion and placed 8 French sheaths.  The patient was then given  6000 units of intravenous heparin.   The Pigtail catheter was placed into the aorta from the  left side. Using this image, we selected a 31 x 14 x 13 Main body device.  Over a stiff wire, an 76 French sheath was placed. The main body was then placed through the 18 French sheath. A  Kumpe catheter was placed up the left side and a magnified image at the renal arteries was performed. The main body was then deployed just below the lowest renal artery. The Kumpe catheter was used to cannulate the contralateral gate without difficulty and successful cannulation was confirmed by twirling the pigtail catheter in the main body. We then placed a stiff wire and a retrograde arteriogram was performed through the left femoral sheath. We upsized to the 12 [place] sheath for the contralateral limb and a 16 x 12 x 7 iliac extender limb was selected and deployed. Subsequently a 20 x 14 contralateral limb was advanced through the left femoral sheath and deployed down to the iliac bifurcation. The main body deployment was then completed. Based off the angiographic findings, extension limbs were necessary bilaterally as noted above the left extender has already been placed.  From the right side retrograde injection is then performed and subsequently a 23 x 12 iliac extender is opened onto the field advanced up the right sided sheath and deployed without difficulty extending the right limb down to the iliac bifurcation. All junction points and seals zones were treated with the compliant balloon.   The pigtail catheter was then replaced and a completion angiogram was performed.   No Endoleak was detected on completion angiography. The renal arteries were found to be widely patent. There appears to be successful exclusion of the aneurysm with excellent seal at all the junctures.   At this point we elected to terminate  the procedure. We secured the pro glide devices for hemostasis on the femoral arteries. The skin incision was closed with a 4-0 Monocryl. Dermabond and pressure dressing were placed. The patient was taken to the recovery room in stable condition having tolerated the procedure well.  COMPLICATIONS: none  CONDITION: stable  [name]  10/31/2014, 3:51 PM

## 2016-06-07 NOTE — H&P (Signed)
HPI Joe Pena is a 65 y.o. male.  I am asked to see the patient by Dr. Ouida Sills for evaluation of a large abdominal aortic aneurysm.  The patient reports a pulsatile abdominal mass over the past few months with some vague left flank and lower abdominal pain. He denies any signs of peripheral embolization. He denies any back pain. He has a family history of aneurysm with a mother who had an aneurysm some years ago. He had no previous knowledge of aneurysm personally or diagnosis of an aneurysm. Secondary to his symptoms, he was sent for a CT scan of the abdomen and pelvis today which I have independently reviewed. This demonstrates a very large infrarenal abdominal aortic aneurysm measuring 8 cm in maximal diameter. There is a very large flow lumen. There is no evidence of rupture.      Past Medical History:  Diagnosis Date  . AAA (abdominal aortic aneurysm) ([place])   . Hyperlipidemia   . Hypertension   . 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 HERNIA REPAIR    . KNEE SURGERY Right          Family History  Problem Relation Age of Onset  . Heart disease Mother   . Aortic aneurysm Mother   . Hyperlipidemia Father   No bleeding or clotting disorders  Social History        Social History   Substance Use Topics   . Smoking status: Never Smoker   . Smokeless tobacco: Never Used   . Alcohol use Yes     Comment: 2 times weekly   Married for over 40 years and wife accompanies him today      Allergies  Allergen Reactions  . Pollen Extract Shortness Of Breath and Itching   Medications at home include Tylenol, Exforge, Lipitor, Avapro, Norvasc and Flonase No current facility-administered medications for this visit.    No current outpatient prescriptions on file.            Facility-Administered Medications Ordered in Other Visits  Medication Dose Route Frequency Provider Last Rate Last Dose  . [START ON  06/07/2016] 0.9 %  sodium chloride infusion   Intravenous Continuous Kimberly A Stegmayer, PA-C      . acetaminophen (TYLENOL) tablet 650 mg  650 mg Oral Q6H PRN Kimberly A Stegmayer, PA-C      . amLODipine (NORVASC) tablet 10 mg  10 mg Oral Daily Kimberly A Stegmayer, PA-C       And  . irbesartan (AVAPRO) tablet 300 mg  300 mg Oral Daily Kimberly A Stegmayer, PA-C      . atorvastatin (LIPITOR) tablet 40 mg  40 mg Oral Daily Kimberly A Stegmayer, PA-C      . ceFAZolin (ANCEF) IVPB 2g/100 mL premix  2 g Intravenous On Call to [employer], PA-C      . Chlorhexidine Gluconate Cloth 2 % PADS 6 each  6 each Topical Once [employer], PA-C       And  . Chlorhexidine Gluconate Cloth 2 % PADS 6 each  6 each Topical Once [employer], PA-C      . heparin injection 5,000 Units  5,000 Units Subcutaneous Q8H Kimberly A Stegmayer, PA-C      . iopamidol (ISOVUE-300) 61 % injection 100 mL  100 mL Intravenous Once PRN Kirk Ruths, MD          REVIEW OF  SYSTEMS (Negative unless checked)  Constitutional: [] Weight loss  [] Fever  [] Chills Cardiac: [] Chest pain   [] Chest pressure   [] Palpitations   [] Shortness of breath when laying flat   [] Shortness of breath at rest   [] Shortness of breath with exertion. Vascular:  [] Pain in legs with walking   [] Pain in legs at rest   [] Pain in legs when laying flat   [] Claudication   [] Pain in feet when walking  [] Pain in feet at rest  [] Pain in feet when laying flat   [] History of DVT   [] Phlebitis   [] Swelling in legs   [] Varicose veins   [] Non-healing ulcers Pulmonary:   [] Uses home oxygen   [] Productive cough   [] Hemoptysis   [] Wheeze  [] COPD   [] Asthma Neurologic:  [] Dizziness  [x] Blackouts   [] Seizures   [] History of stroke   [] History of TIA  [] Aphasia   [] Temporary blindness   [] Dysphagia   [] Weakness or numbness in arms   [] Weakness or numbness in legs Musculoskeletal:  [] Arthritis   [] Joint swelling   [] Joint pain   [] Low  back pain Hematologic:  [] Easy bruising  [] Easy bleeding   [] Hypercoagulable state   [] Anemic  [] Hepatitis Gastrointestinal:  [] Blood in stool   [] Vomiting blood  [] Gastroesophageal reflux/heartburn   [] Abdominal pain Genitourinary:  [] Chronic kidney disease   [] Difficult urination  [] Frequent urination  [] Burning with urination   [] Hematuria Skin:  [] Rashes   [] Ulcers   [] Wounds Psychological:  [] History of anxiety   []  History of major depression.    Physical Exam BP (!) 146/100   Pulse 73   Resp 17   Ht 6\' 4"  (1.93 m)   Wt 101.6 kg (224 lb)   BMI 27.27 kg/m  Gen:  WD/WN, NAD. Ears younger than stated age  Head: [place]/AT, No temporalis wasting. Prominent temp pulse not noted. Ear/Nose/Throat: Hearing grossly intact, nares w/o erythema or drainage, oropharynx w/o Erythema/Exudate Eyes: Conjunctiva clear, sclera non-icteric  Neck: trachea midline.  No bruit or JVD.  Pulmonary:  Good air movement, clear to auscultation bilaterally.  Cardiac: RRR, normal S1, S2, no Murmurs, rubs or gallops. Vascular:  Vessel Right Left  Radial Palpable Palpable  Ulnar Palpable Palpable  Brachial Palpable Palpable  Carotid Palpable, without bruit Palpable, without bruit  Aorta Enlarged,  palpable N/A  Femoral Palpable Palpable  Popliteal Palpable Palpable  PT Palpable Palpable  DP Palpable Palpable   Gastrointestinal: soft, non-tender/non-distended. No guarding/reflex. No masses, surgical incisions, or scars. Musculoskeletal: M/S 5/5 throughout.  Extremities without ischemic changes.  No deformity or atrophy. No edema. Neurologic: Sensation grossly intact in extremities.  Symmetrical.  Speech is fluent. Motor exam as listed above. Psychiatric: Judgment intact, Mood & affect appropriate for pt's clinical situation. Dermatologic: No rashes or ulcers noted.  No cellulitis or open wounds. Lymph : No Cervical, Axillary, or Inguinal lymphadenopathy.   Radiology  Imaging Results  Ct Abdomen  Pelvis W Contrast  Result Date: 06/06/2016 CLINICAL DATA:  LEFT lower quadrant mass/not. Pulsatile feeling in stomach for 2-3 months. Prior inguinal hernia repair . EXAM: CT ABDOMEN AND PELVIS WITH CONTRAST TECHNIQUE: Multidetector CT imaging of the abdomen and pelvis was performed using the standard protocol following bolus administration of intravenous contrast. CONTRAST:  100 mL Isovue COMPARISON:  None. FINDINGS: Lower chest: Lung bases are clear. Hepatobiliary: The biliary duct dilatation. Low-density cysts in the RIGHT hepatic lobe. Gallbladder normal. Pancreas: Pancreas is normal. No ductal dilatation. No pancreatic inflammation. Spleen: Normal spleen Adrenals/urinary tract: Simple fluid attenuation cyst  the pole of the RIGHT kidney. No renal obstruction. Ureters bladder normal. Stomach/Bowel: Stomach, small bowel, appendix, and cecum are normal. The colon and rectosigmoid colon are normal. Vascular/Lymphatic: There is fusiform aneurysmal dilatation of the infrarenal abdominal aorta. Aneurysm measures 8 cm in AP dimension and extends over approximately 12 cm to the level of bifurcation. There is no evidence of acute rupture or inflammation of the aorta wall. Iliac arteries demonstrate mild intimal calcification and are non aneurysmal. Reproductive: Prostate normal Other: LEFT inguinal hernia repair with mesh. Small umbilical hernia with 13 mm mouth and 27 mm sac. Musculoskeletal: No aggressive osseous lesion. IMPRESSION: 1. Large fusiform infrarenal abdominal aortic aneurysm measuring up to 8 cm in AP dimension. No evidence of immediate impending rupture or inflammation. Patient was examined and is asymptomatic. Prompt Vascular surgery consultation recommended due to increased risk of rupture for AAA >5.5 cm. This recommendation follows ACR consensus guidelines: White Paper of the ACR Incidental Findings Committee II on Vascular Findings. J Am Coll Radiol 2013; 10:789-794. Findings conveyed toMARSHALL  ANDERSON on 06/06/2016  at8:45. 2.  LEFT inguinal hernia repair and small umbilical hernia. Electronically Signed   By: Suzy Bouchard M.D.   On: 06/06/2016 09:18     Labs No results found for this or any previous visit (from the past 2160 hour(s)).  Assessment/Plan:  Pure hypercholesterolemia lipid control important in reducing the progression of atherosclerotic disease. Continue statin therapy   Essential hypertension blood pressure control important in reducing the progression of atherosclerotic disease. On appropriate oral medications. Discussed the blood pressure control is very important and avoiding aneurysm progression, and avoidance of major blood pressure with a known abdominal aortic aneurysm is important.   AAA (abdominal aortic aneurysm) without rupture [facility]) The patient has an extremely large abdominal aortic aneurysm. CT scan of the abdomen and pelvis today which I have independently reviewed. This demonstrates a very large infrarenal abdominal aortic aneurysm measuring 8 cm in maximal diameter. There is a very large flow lumen. There is no evidence of rupture. He does appear to have anatomy that we'll allow endovascular repair. We had a long discussion today regarding abdominal aortic aneurysms. Clearly one of this size is a major, pressing problem and should be fixed soon. I have recommended admission to the hospital for appropriate preoperative testing and performance of his aneurysm repair tomorrow. I have discussed the risks and benefits of surgical repair and endovascular repair. He prefers and endovascular repair which I would agree with. He and his wife are agreeable with our plan of care.      Leotis Pain

## 2016-06-07 NOTE — Progress Notes (Signed)
Patient off unit prior to shift change. Day shift RN. Received handoff report from off going shift RN.

## 2016-06-07 NOTE — Transfer of Care (Signed)
Immediate Anesthesia Transfer of Care Note  Patient: Joe Pena  Procedure(s) Performed: Procedure(s): Endovascular Repair/Stent Graft (N/A)  Patient Location: PACU  Anesthesia Type:General  Level of Consciousness: awake  Airway & Oxygen Therapy: Patient Spontanous Breathing and Patient connected to face mask oxygen  Post-op Assessment: Report given to RN and Post -op Vital signs reviewed and stable  Post vital signs: Reviewed  Last Vitals:  Vitals:   06/07/16 0718 06/07/16 0957  BP: (!) 149/109 123/80  Pulse: 65 80  Resp: 17 18  Temp: 36.7 C 36.7 C    Last Pain:  Vitals:   06/07/16 0718  TempSrc: Oral         Complications: No apparent anesthesia complications

## 2016-06-07 NOTE — Anesthesia Procedure Notes (Signed)
Anesthesia Procedure Note L radial arterial line placed under sterile technique.  Good waveform, neg hematoma. 20 ga. DSD applied zero and transduced

## 2016-06-07 NOTE — Op Note (Signed)
OPERATIVE NOTE   PROCEDURE: 1. US guidance for vascular access, bilateral femoral arteries 2. Catheter placement into aorta from bilateral femoral approaches 3. Placement of a 31 mm proximal 13 cm length Gore Excluder Endoprosthesis main body right with a 16 mm diameter by 7 cm length iliac extender for the initial placement of the left contralateral limb 4. Placement of a 20 mm diameter by 14 cm length left iliac extension limb 5.   Placement of a 23 mm diameter by 12 cm length right iliac extension limb 6.   ProGlide closure devices bilateral femoral arteries  PRE-OPERATIVE DIAGNOSIS: 8 cm infrarenal AAA  POST-OPERATIVE DIAGNOSIS: same  SURGEON: Leotis Pain, MD and Hortencia Pilar, MD - Co-surgeons  ANESTHESIA: Gen.  ESTIMATED BLOOD LOSS: 50 cc  FINDING(S): 1.  AAA  SPECIMEN(S):  none  INDICATIONS:   Hymen Sandberg is a 65 y.o. male who presents with an 8 cm infrarenal abdominal aortic aneurysm. The anatomy was suitable for endovascular repair.  Risks and benefits of repair in an endovascular fashion were discussed and informed consent was obtained.  DESCRIPTION: After obtaining full informed written consent, the patient was brought back to the operating room and placed supine upon the operating table.  The patient received IV antibiotics prior to induction.  After obtaining adequate anesthesia, the patient was prepped and draped in the standard fashion for endovascular AAA repair.  We then began by gaining access to both femoral arteries with US guidance with me working on the right and Dr. Delana Meyer working on the left.  The femoral arteries were found to be patent and accessed without difficulty with a needle under ultrasound guidance without difficulty on each side and permanent images were recorded.  We then placed 2 proglide devices on each side in a pre-close fashion and placed 8 French sheaths. The patient was then given 6000 units of intravenous heparin. The Pigtail catheter  was placed into the aorta from the left side. Using this image, we selected a 31 mm diameter by 13 cm length Main body device.  Over a stiff wire, an 51 French sheath was placed up the right. The main body was then placed through the 18 French sheath. A Kumpe catheter was placed up the left side and a magnified image at the renal arteries was performed. The main body was then deployed just below the lowest renal artery which was the right renal artery. The Kumpe catheter was used to cannulate the contralateral gate without difficulty and successful cannulation was confirmed by twirling the pigtail catheter in the main body. We then placed a stiff wire and a retrograde arteriogram was performed through the left femoral sheath. We upsized to the 12 [place] sheath on the left for the contralateral limb and a 16 mm diameter by 7 cm length limb was selected and deployed. The main body deployment was then completed. Based off the angiographic findings, extension limbs were necessary bilaterally to seal the ectatic or slightly aneurysmal iliac arteries.  On the left, a 20 mm diameter by 14 cm length iliac extension limb was deployed just above the hypogastric artery. On the right, a 23 mm diameter by 12 cm length extension limb was deployed just above the hypogastric artery. All junction points and seals zones were treated with the compliant balloon. The pigtail catheter was then replaced and a completion angiogram was performed.  No Endoleak was detected on completion angiography. The renal arteries were found to be widely patent. The hypogastric arteries were found  to be widely patent . At this point we elected to terminate the procedure. We secured the pro glide devices for hemostasis on the femoral arteries. The skin incision was closed with a 4-0 Monocryl. Dermabond and pressure dressing were placed. The patient was taken to the recovery room in stable condition having tolerated the procedure well.  COMPLICATIONS:  none  CONDITION: stable  Leotis Pain  06/07/2016, 9:45 AM   This note was created with Dragon Medical transcription system. Any errors in dictation are purely unintentional.

## 2016-06-08 ENCOUNTER — Encounter: Payer: Self-pay | Admitting: Vascular Surgery

## 2016-06-08 DIAGNOSIS — I714 Abdominal aortic aneurysm, without rupture: Principal | ICD-10-CM

## 2016-06-08 LAB — BASIC METABOLIC PANEL
Anion gap: 6 (ref 5–15)
BUN: 20 mg/dL (ref 6–20)
CALCIUM: 8.5 mg/dL — AB (ref 8.9–10.3)
CO2: 26 mmol/L (ref 22–32)
CREATININE: 1.4 mg/dL — AB (ref 0.61–1.24)
Chloride: 104 mmol/L (ref 101–111)
GFR calc non Af Amer: 51 mL/min — ABNORMAL LOW (ref >60)
GFR, EST AFRICAN AMERICAN: 59 mL/min — AB (ref >60)
Glucose, Bld: 118 mg/dL — ABNORMAL HIGH (ref 65–99)
Potassium: 3.9 mmol/L (ref 3.5–5.1)
SODIUM: 136 mmol/L (ref 135–145)

## 2016-06-08 LAB — CBC
HCT: 37 % — ABNORMAL LOW (ref 40.0–52.0)
Hemoglobin: 12.5 g/dL — ABNORMAL LOW (ref 13.0–18.0)
MCH: 30.9 pg (ref 26.0–34.0)
MCHC: 33.9 g/dL (ref 32.0–36.0)
MCV: 91.3 fL (ref 80.0–100.0)
PLATELETS: 173 10*3/uL (ref 150–440)
RBC: 4.05 MIL/uL — ABNORMAL LOW (ref 4.40–5.90)
RDW: 14 % (ref 11.5–14.5)
WBC: 12.2 10*3/uL — ABNORMAL HIGH (ref 3.8–10.6)

## 2016-06-08 MED ORDER — HYDROCODONE-ACETAMINOPHEN 5-325 MG PO TABS: ORAL_TABLET | ORAL | 0 refills | Status: DC

## 2016-06-08 NOTE — Discharge Instructions (Signed)
You may shower as of tomorrow. No driving while on pain medication.

## 2016-06-08 NOTE — Care Management Important Message (Signed)
Important Message  Patient Details  Name: Cindy Austgen MRN: [id number] Date of Birth: 1950-10-16   Medicare Important Message Given:  N/A - LOS <3 / Initial given by admissions    Beverly Sessions, RN 06/08/2016, 1:26 PM

## 2016-06-08 NOTE — Progress Notes (Signed)
Per [facility] PA do not give BP meds will resume tomorrow

## 2016-06-08 NOTE — Discharge Summary (Signed)
[facility]    Discharge Summary    Patient ID:  Joe Pena MRN: [id number] DOB/AGE: 1951-01-13 65 y.o.  Admit date: 06/06/2016 Discharge date: 06/08/2016 Date of Surgery: 06/06/2016 - 06/07/2016 Surgeon: Surgeon(s): [name], MD Algernon Huxley, MD  Admission Diagnosis: AAA AAA repair    GORE   Discharge Diagnoses:  AAA AAA repair    GORE   Secondary Diagnoses: Past Medical History:  Diagnosis Date  . AAA (abdominal aortic aneurysm) ([place])   . Hyperlipidemia   . Hypertension   . 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 The anatomy was suitable for endovascular repair. On 06/07/16, the patient US guidance for vascular access, bilateral femoral arteries, Catheter placement into aorta from bilateral femoral approaches, Placement of a 31 mm proximal 13 cm length Gore Excluder Endoprosthesis main body right with a 16 mm diameter by 7 cm length iliac extender for the initial placement of the left contralateral limb, Placement of a 20 mm diameter by 14 cm length left iliac extension limb, Placement of a 23 mm diameter by 12 cm length right iliac extension limb and ProGlide closure devices bilateral femoral arteries. He tolerated the procedure well and was transferred to the surgical floor without issue. His night of surgery was unremarkable. During his brief patient stay, his diet was advanced, foley was removed, starting ambulating and his discomfort was controlled with PO medication without issue.   Hospital Course:  Joe Pena is a 65 y.o. male is S/P: Procedure(s): Endovascular Repair/Stent Graft  Extubated: POD # 0  Physical exam:  A&Ox3, NAD CV: RRR Pulm: CTA Bilaterally Abdomen: soft, NT, ND, (+) BS Groins: healing well, no swelling or drainage Extremities:  warm, NT  Post-op wounds clean, dry, intact or healing well  Pt. Ambulating, voiding and taking PO diet without difficulty.  Pt pain controlled with PO pain meds.  Labs as below  Complications:none  Consults:  None  Significant Diagnostic Studies: CBC Lab Results  Component Value Date   WBC 12.2 (H) 06/08/2016   HGB 12.5 (L) 06/08/2016   HCT 37.0 (L) 06/08/2016   MCV 91.3 06/08/2016   PLT 173 06/08/2016   BMET    Component Value Date/Time   NA 136 06/08/2016 0534   NA 137 09/29/2013 1814   K 3.9 06/08/2016 0534   K 3.7 09/29/2013 1814   CL 104 06/08/2016 0534   CL 102 09/29/2013 1814   CO2 26 06/08/2016 0534   CO2 30 09/29/2013 1814   GLUCOSE 118 (H) 06/08/2016 0534   GLUCOSE 108 (H) 09/29/2013 1814   BUN 20 06/08/2016 0534   BUN 15 09/29/2013 1814   CREATININE 1.40 (H) 06/08/2016 0534   CREATININE 2.21 (H) 09/29/2013 1814   CALCIUM 8.5 (L) 06/08/2016 0534   CALCIUM 8.1 (L) 09/29/2013 1814   GFRNONAA 51 (L) 06/08/2016 0534   GFRNONAA 30 (L) 09/29/2013 1814   GFRAA 59 (L) 06/08/2016 0534   GFRAA 35 (L) 09/29/2013 1814   COAG Lab Results  Component Value Date   INR 1.05 06/06/2016   Disposition:  Discharge to :Home    Medication List    TAKE these medications   amLODipine-valsartan 10-320 MG tablet Commonly known as:  EXFORGE Take 1 tablet by mouth daily.   aspirin 81 MG tablet Take 81 mg by mouth daily.  atorvastatin 40 MG tablet Commonly known as:  LIPITOR Take 1 tablet by mouth daily.   CENTRUM SILVER PO Take 1 tablet by mouth daily.   fluticasone 50 MCG/ACT nasal spray Commonly known as:  FLONASE Place 1-2 sprays into the nose as needed for allergies.   HYDROcodone-acetaminophen 5-325 MG tablet Commonly known as:  NORCO/VICODIN One to Two Tabs By Mouth Every Four to Six Hours As Needed For Pain      Verbal and written Discharge instructions given to the patient. Wound care per Discharge AVS Follow-up Information    Leotis Pain,  MD Follow up in 10 day(s).   Specialties:  Vascular Surgery, Radiology, Interventional Cardiology Why:  Post AAA Incision Check Contact information: [place] [address] [phone]          Signed: Sela Hua, PA-C  06/08/2016, 9:34 AM

## 2016-06-08 NOTE — Progress Notes (Signed)
Per [facility] PA okay to place order to remove foley catheter prior to patient discharge

## 2016-06-08 NOTE — Progress Notes (Signed)
Pt A and O x 4. VSS. Pt tolerating diet well. No complaints of pain or nausea. IV removed intact, prescriptions given. Pt and wife voiced understanding of discharge instructions with no further questions. Pt discharged via wheelchair with axillary.

## 2016-06-09 LAB — TYPE AND SCREEN
ABO/RH(D): O POS
Antibody Screen: NEGATIVE
Unit division: 0
Unit division: 0

## 2016-06-09 LAB — PREPARE RBC (CROSSMATCH)

## 2016-06-09 NOTE — Anesthesia Postprocedure Evaluation (Signed)
Anesthesia Post Note  Patient: [name]  Procedure(s) Performed: Procedure(s) (LRB): Endovascular Repair/Stent Graft (N/A)  Patient location during evaluation: PACU Anesthesia Type: General Level of consciousness: awake and alert Pain management: pain level controlled Vital Signs Assessment: post-procedure vital signs reviewed and stable Respiratory status: spontaneous breathing, nonlabored ventilation, respiratory function stable and patient connected to nasal cannula oxygen Cardiovascular status: blood pressure returned to baseline and stable Postop Assessment: no signs of nausea or vomiting Anesthetic complications: no    Last Vitals:  Vitals:   06/08/16 0411 06/08/16 1104  BP: 103/68 (!) 102/58  Pulse: 63   Resp: 17   Temp: 36.7 C     Last Pain:  Vitals:   06/08/16 0411  TempSrc: Oral  PainSc:                  Joe Pena

## 2016-06-13 ENCOUNTER — Ambulatory Visit (INDEPENDENT_AMBULATORY_CARE_PROVIDER_SITE_OTHER): Payer: Medicare Other | Admitting: Urology

## 2016-06-13 ENCOUNTER — Encounter: Payer: Self-pay | Admitting: Urology

## 2016-06-13 VITALS — BP 111/75 | HR 73 | Ht 76.0 in | Wt 214.8 lb

## 2016-06-13 DIAGNOSIS — R972 Elevated prostate specific antigen [PSA]: Secondary | ICD-10-CM

## 2016-06-13 DIAGNOSIS — N400 Enlarged prostate without lower urinary tract symptoms: Secondary | ICD-10-CM

## 2016-06-13 NOTE — Progress Notes (Signed)
06/13/2016 3:05 PM   Joe Pena 30-Dec-1950 [id number]  Referring provider: Kirk Ruths, MD [facility] [place], [place] [address]  Chief Complaint  Patient presents with  . Elevated PSA  . Benign Prostatic Hypertrophy    HPI: Joe Pena is a 64yo seen for evaluation today for elevated PSA. PSA in referral is 4.09. PSA 1 year ago was 2.45 and in 2015 was 2.28. AUA score is a 5 with a bother of 2. Nocturia 3x. He has morning frequency. He denies recurrent prostate infections. No hx of UTIs.  He had intercourse the morning his PSA was drawn.   He had a vascular endograft for an 8cm AAA placed 1 week ago. He is on ASA.      PMH: Past Medical History:  Diagnosis Date  . AAA (abdominal aortic aneurysm) ([place])   . Hyperlipidemia   . Hypertension   . 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 ACHILLES TENDON SURGERY    . HERNIA REPAIR    . KNEE SURGERY Right   . PERIPHERAL VASCULAR CATHETERIZATION N/A 06/07/2016   Procedure: Endovascular Repair/Stent Graft;  Surgeon: [name], MD;  Location: [facility];  Service: Cardiovascular;  Laterality: N/A;    Home Medications:  Allergies as of 06/13/2016      Reactions   Pollen Extract Shortness Of Breath, Itching      Medication List       Accurate as of 06/13/16  3:05 PM. Always use your most recent med list.          amLODipine-valsartan 10-320 MG tablet Commonly known as:  EXFORGE Take 1 tablet by mouth daily.   aspirin 81 MG tablet Take 81 mg by mouth daily.   atorvastatin 40 MG tablet Commonly known as:  LIPITOR Take 1 tablet by mouth daily.   CENTRUM SILVER PO Take 1 tablet by mouth daily.   fluticasone 50 MCG/ACT nasal spray Commonly known as:  FLONASE Place 1-2 sprays into the nose as needed for allergies.   HYDROcodone-acetaminophen 5-325 MG tablet Commonly known as:   NORCO/VICODIN One to Two Tabs By Mouth Every Four to Six Hours As Needed For Pain       Allergies:  Allergies  Allergen Reactions  . Pollen Extract Shortness Of Breath and Itching    Family History: Family History  Problem Relation Age of Onset  . Heart disease Mother   . Aortic aneurysm Mother   . Hyperlipidemia Father   . Prostate cancer Neg Hx   . Bladder Cancer Neg Hx   . Kidney cancer Neg Hx     Social History:  reports that he has never smoked. He has never used smokeless tobacco. He reports that he drinks alcohol. He reports that he does not use drugs.  ROS: UROLOGY Frequent Urination?: No Hard to postpone urination?: No Burning/pain with urination?: No Get up at night to urinate?: Yes Leakage of urine?: No Urine stream starts and stops?: No Trouble starting stream?: No Do you have to strain to urinate?: No Blood in urine?: No Urinary tract infection?: No Sexually transmitted disease?: No Injury to kidneys or bladder?: No Painful intercourse?: No Weak stream?: No Erection problems?: No Penile pain?: No  Gastrointestinal Nausea?: No Vomiting?: No Indigestion/heartburn?: Yes Diarrhea?: No Constipation?: No  Constitutional Fever: No Night sweats?: No Weight loss?: No Fatigue?: No  Skin Skin rash/lesions?: No Itching?: No  Eyes Blurred vision?: No Double vision?: No  Ears/Nose/Throat Sore throat?: No Sinus problems?: No  Hematologic/Lymphatic Swollen glands?: No Easy bruising?: No  Cardiovascular Leg swelling?: No Chest pain?: No  Respiratory Cough?: No Shortness of breath?: No  Endocrine Excessive thirst?: No  Musculoskeletal Back pain?: No Joint pain?: No  Neurological Headaches?: No Dizziness?: No  Psychologic Depression?: No Anxiety?: No  Physical Exam: BP 111/75 (BP Location: Left Arm, Patient Position: Sitting, Cuff Size: Large)   Pulse 73   Ht 6\' 4"  (1.93 m)   Wt 97.4 kg (214 lb 12.8 oz)   BMI 26.15 kg/m    Constitutional:  Alert and oriented, No acute distress. HEENT: [place] AT, moist mucus membranes.  Trachea midline, no masses. Cardiovascular: No clubbing, cyanosis, or edema. Respiratory: Normal respiratory effort, no increased work of breathing. GI: Abdomen is soft, nontender, nondistended, no abdominal masses GU: No CVA tenderness. Uncircumcised phallus, no masses/lesions on penis/testes/scrotum. Prostate 40g smooth, no nodules, no induration. Skin: No rashes, bruises or suspicious lesions. Lymph: No cervical or inguinal adenopathy. Neurologic: Grossly intact, no focal deficits, moving all 4 extremities. Psychiatric: Normal mood and affect.  Laboratory Data: Lab Results  Component Value Date   WBC 12.2 (H) 06/08/2016   HGB 12.5 (L) 06/08/2016   HCT 37.0 (L) 06/08/2016   MCV 91.3 06/08/2016   PLT 173 06/08/2016    Lab Results  Component Value Date   CREATININE 1.40 (H) 06/08/2016    No results found for: PSA  No results found for: TESTOSTERONE  No results found for: HGBA1C  Urinalysis    Component Value Date/Time   COLORURINE Amber 09/29/2013 1855   APPEARANCEUR Hazy 09/29/2013 1855   LABSPEC 1.028 09/29/2013 1855   PHURINE 5.0 09/29/2013 1855   GLUCOSEU Negative 09/29/2013 1855   HGBUR Negative 09/29/2013 1855   BILIRUBINUR Negative 09/29/2013 1855   KETONESUR Negative 09/29/2013 1855   PROTEINUR 30 mg/dL 09/29/2013 1855   NITRITE Negative 09/29/2013 1855   LEUKOCYTESUR Negative 09/29/2013 1855    Pertinent Imaging: CT abd/pelvis  Assessment & Plan:    1. Elevated PSA - PSA today will call with results, if elevated will proceed with prostate biopsy -if PSA around 3 will RTC 6 months with PSA   2. Benign prostatic hyperplasia, unspecified whether lower urinary tract symptoms present Fluid management for nocturia - BLADDER SCAN AMB NON-IMAGING   No Follow-up on file.  Nicolette Bang, MD  [facility] [address] [place],  [address] [phone]

## 2016-06-14 LAB — PSA: PROSTATE SPECIFIC AG, SERUM: 3.1 ng/mL (ref 0.0–4.0)

## 2016-06-15 ENCOUNTER — Telehealth: Payer: Self-pay

## 2016-06-15 DIAGNOSIS — R972 Elevated prostate specific antigen [PSA]: Secondary | ICD-10-CM

## 2016-06-15 NOTE — Telephone Encounter (Signed)
Joe Gustin, MD  Lestine Box, LPN        Call and tell him his PSA has decreased to 3.1 and he should followup in 6 months with a PSA    Spoke with pt in reference to PSA results and needing a f/u in 64mo. Pt voiced understanding. Pt was transferred to the front to make f/u appt. Orders placed.

## 2016-06-20 ENCOUNTER — Encounter (INDEPENDENT_AMBULATORY_CARE_PROVIDER_SITE_OTHER): Payer: Self-pay | Admitting: Vascular Surgery

## 2016-06-20 ENCOUNTER — Ambulatory Visit (INDEPENDENT_AMBULATORY_CARE_PROVIDER_SITE_OTHER): Payer: Medicare Other | Admitting: Vascular Surgery

## 2016-06-20 VITALS — BP 128/82 | HR 82 | Resp 16 | Ht 76.0 in | Wt 215.0 lb

## 2016-06-20 DIAGNOSIS — I10 Essential (primary) hypertension: Secondary | ICD-10-CM

## 2016-06-20 DIAGNOSIS — I714 Abdominal aortic aneurysm, without rupture, unspecified: Secondary | ICD-10-CM

## 2016-06-20 NOTE — Assessment & Plan Note (Signed)
Is doing well about 2 weeks after endovascular abdominal aortic aneurysm repair. Return to normal activities as tolerated without restrictions. Continue aspirin and Lipitor. Return in 3 months with duplex

## 2016-06-20 NOTE — Progress Notes (Signed)
    Patient ID: Joe Pena, male   DOB: 1951-01-20, 65 y.o.   MRN: [id number]  Chief Complaint  Patient presents with  . Routine Post Op    10 day post op    HPI Joe Pena is a 65 y.o. male.  Patient returns 2 weeks after endovascular abdominal aortic aneurysm repair for 8 cm abdominal aortic aneurysm. He is doing quite well. He has had no specific complaints. His pain is gone. His energy level is returning to normal but has not entirely normalized. He denies fever or chills.   Past Medical History:  Diagnosis Date  . AAA (abdominal aortic aneurysm) ([place])   . Hyperlipidemia   . Hypertension   . 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 ACHILLES TENDON SURGERY    . HERNIA REPAIR    . KNEE SURGERY Right   . PERIPHERAL VASCULAR CATHETERIZATION N/A 06/07/2016   Procedure: Endovascular Repair/Stent Graft;  Surgeon: [name], MD;  Location: [facility];  Service: Cardiovascular;  Laterality: N/A;      Allergies  Allergen Reactions  . Pollen Extract Shortness Of Breath and Itching    Current Outpatient Prescriptions  Medication Sig Dispense Refill  . amLODipine-valsartan (EXFORGE) 10-320 MG tablet Take 1 tablet by mouth daily.    [name] aspirin 81 MG tablet Take 81 mg by mouth daily.    [name] atorvastatin (LIPITOR) 40 MG tablet Take 1 tablet by mouth daily.    . fluticasone (FLONASE) 50 MCG/ACT nasal spray Place 1-2 sprays into the nose as needed for allergies.    [name] HYDROcodone-acetaminophen (NORCO/VICODIN) 5-325 MG tablet One to Two Tabs By Mouth Every Four to Six Hours As Needed For Pain 40 tablet 0  . Multiple Vitamins-Minerals (CENTRUM SILVER PO) Take 1 tablet by mouth daily.     No current facility-administered medications for this visit.         Physical Exam BP 128/82 (BP Location: Right Arm)   Pulse 82   Resp 16   Ht 6\' 4"  (1.93 m)   Wt 215 lb (97.5 kg)   BMI 26.17 kg/m  Gen:  WD/WN, NAD Skin:  incision C/D/I     Assessment/Plan:  Essential hypertension blood pressure control important in reducing the progression of atherosclerotic disease. On appropriate oral medications.   AAA (abdominal aortic aneurysm) without rupture ([place]) Is doing well about 2 weeks after endovascular abdominal aortic aneurysm repair. Return to normal activities as tolerated without restrictions. Continue aspirin and Lipitor. Return in 3 months with duplex      Leotis Pain 06/20/2016, 12:21 PM   This note was created with Dragon medical transcription system.  Any errors from dictation are unintentional.

## 2016-06-20 NOTE — Assessment & Plan Note (Signed)
blood pressure control important in reducing the progression of atherosclerotic disease. On appropriate oral medications.  

## 2016-08-04 ENCOUNTER — Encounter: Payer: Self-pay | Admitting: Internal Medicine

## 2016-08-09 ENCOUNTER — Ambulatory Visit (INDEPENDENT_AMBULATORY_CARE_PROVIDER_SITE_OTHER): Payer: Medicare Other | Admitting: Internal Medicine

## 2016-08-09 ENCOUNTER — Encounter (INDEPENDENT_AMBULATORY_CARE_PROVIDER_SITE_OTHER): Payer: Self-pay

## 2016-08-09 ENCOUNTER — Encounter: Payer: Self-pay | Admitting: Internal Medicine

## 2016-08-09 VITALS — BP 118/84 | HR 69 | Ht 76.0 in | Wt 217.0 lb

## 2016-08-09 DIAGNOSIS — R55 Syncope and collapse: Secondary | ICD-10-CM

## 2016-08-09 NOTE — Progress Notes (Signed)
HPI [name] returns for ongoing evaluation of syncope. He is a pleasant otherwise healthy 66 yo man with dyslipidemia. He presented initially with syncope several months ago. His episodes were typical and consistent with autonomic dysfunction/vasomotor syncope. The patient subsequently was found to have a large abdominal aneurysm and had it repaired endovascularly. After surgery, he had one additional episode of syncope when he was sitting on the toilet, got clammy, nauseated and diaphoretic, tried to get up and passed out. He noted abdominal pain from constipation as the underlying noxious stimulus. No episodes since then.  Allergies  Allergen Reactions  . Pollen Extract Shortness Of Breath and Itching     Current Outpatient Prescriptions  Medication Sig Dispense Refill  . amLODipine-valsartan (EXFORGE) 10-320 MG tablet Take 1 tablet by mouth daily.    [name] aspirin 81 MG tablet Take 81 mg by mouth daily.    [name] atorvastatin (LIPITOR) 40 MG tablet Take 1 tablet by mouth daily.    . fluticasone (FLONASE) 50 MCG/ACT nasal spray Place 1-2 sprays into the nose as needed for allergies.    [name] HYDROcodone-acetaminophen (NORCO/VICODIN) 5-325 MG tablet One to Two Tabs By Mouth Every Four to Six Hours As Needed For Pain 40 tablet 0  . Multiple Vitamins-Minerals (CENTRUM SILVER PO) Take 1 tablet by mouth daily.     No current facility-administered medications for this visit.      Past Medical History:  Diagnosis Date  . AAA (abdominal aortic aneurysm) ([place])   . Hyperlipidemia   . Hypertension   . 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   Past Surgical History:  Procedure Laterality Date  . ACHILLES TENDON SURGERY    . HERNIA REPAIR    . KNEE SURGERY Right   . PERIPHERAL VASCULAR CATHETERIZATION N/A 06/07/2016   Procedure: Endovascular Repair/Stent Graft;  Surgeon: [name], MD;  Location: [facility];   Service: Cardiovascular;  Laterality: N/A;     Family History  Problem Relation Age of Onset  . Heart disease Mother   . Aortic aneurysm Mother   . Hyperlipidemia Father   . Prostate cancer Neg Hx   . Bladder Cancer Neg Hx   . Kidney cancer Neg Hx      Social History   Social History  . Marital status: Married    Spouse name: N/A  . Number of children: N/A  . Years of education: N/A   Occupational History  . Not on file.   Social History Main Topics  . Smoking status: Never Smoker  . Smokeless tobacco: Never Used  . Alcohol use Yes     Comment: 2 times weekly  . Drug use: No  . Sexual activity: Not on file   Other Topics Concern  . Not on file   Social History Narrative  . No narrative on file     BP 118/84   Pulse 69   Ht 6\' 4"  (1.93 m)   Wt 217 lb (98.4 kg)   SpO2 98%   BMI 26.41 kg/m   Physical Exam:  Well appearing 66 yo man, NAD HEENT: Unremarkable Neck:  6 cm JVD, no thyromegally Lymphatics:  No adenopathy Back:  No CVA tenderness Lungs:  Clear with no wheezes HEART:  Regular rate rhythm, no murmurs, no rubs, no clicks Abd:  soft, positive bowel sounds, no organomegally, no rebound, no guarding Ext:  2 plus  pulses, no edema, no cyanosis, no clubbing Skin:  No rashes no nodules Neuro:  CN II through XII intact, motor grossly intact    Assess/Plan: 1. Syncope - his echo shows normal LV function and his symptoms remain consistent with autonomic dysfunction. I have discussed the treatment options with the patient. He is encouraged to increase his salt and fluid intake and to avoid caffeine and overheating.  I spent 30 minutes with the patient and his wife discussing his diagnosis. He is enocuraged to avoid dehydration, caffeine, missing meals and I have asked him to eat more salt. 2. AAA - s/p endovascular repair -  He continues to do well. Will follow. 3. HTN - his blood pressure is good. He will continue his current meds. I would like for his  pressure to remain on the high side of normal and not be driven down too low. Carleene Overlie Taylor,MD

## 2016-08-09 NOTE — Patient Instructions (Signed)

## 2016-09-20 ENCOUNTER — Ambulatory Visit (INDEPENDENT_AMBULATORY_CARE_PROVIDER_SITE_OTHER): Payer: Medicare Other | Admitting: Vascular Surgery

## 2016-09-20 ENCOUNTER — Other Ambulatory Visit (INDEPENDENT_AMBULATORY_CARE_PROVIDER_SITE_OTHER): Payer: Medicare Other

## 2016-09-27 ENCOUNTER — Encounter (INDEPENDENT_AMBULATORY_CARE_PROVIDER_SITE_OTHER): Payer: Self-pay | Admitting: Vascular Surgery

## 2016-09-27 ENCOUNTER — Ambulatory Visit (INDEPENDENT_AMBULATORY_CARE_PROVIDER_SITE_OTHER): Payer: Medicare Other | Admitting: Vascular Surgery

## 2016-09-27 ENCOUNTER — Other Ambulatory Visit (INDEPENDENT_AMBULATORY_CARE_PROVIDER_SITE_OTHER): Payer: Medicare Other

## 2016-09-27 VITALS — BP 155/99 | HR 62 | Resp 16 | Ht 76.0 in | Wt 217.0 lb

## 2016-09-27 DIAGNOSIS — E7800 Pure hypercholesterolemia, unspecified: Secondary | ICD-10-CM | POA: Diagnosis not present

## 2016-09-27 DIAGNOSIS — I714 Abdominal aortic aneurysm, without rupture, unspecified: Secondary | ICD-10-CM

## 2016-09-27 DIAGNOSIS — I10 Essential (primary) hypertension: Secondary | ICD-10-CM | POA: Diagnosis not present

## 2016-09-27 NOTE — Progress Notes (Signed)
Subjective:    Patient ID: Joe Pena, male    DOB: 06/17/1951, 66 y.o.   MRN: [id number] Chief Complaint  Patient presents with  . Re-evaluation    Ultrasound follow up   The patient presents for a three month AAA follow up. He is s/p endovascular AAA repair on 06/07/16. He underwent an aortic duplex which was notable for an abdominal aortic aneurysm measuring 8.02cm AP x 7.92cm transverse. No endoleak. This is a decrease in size when compared to a CT on 06/06/16. He denies any symptoms such as back pain, pulsatile abdominal masses or thrombosis in his extremities. His hypertension is adequately controlled (slightly high today however patient states he is normally not hypertensive).   Review of Systems  All other systems reviewed and are negative.     Objective:   Physical Exam  Constitutional: He is oriented to person, place, and time. He appears well-developed and well-nourished. No distress.  HENT:  Head: Normocephalic and atraumatic.  Eyes: Conjunctivae are normal. Pupils are equal, round, and reactive to light.  Neck: Normal range of motion.  Cardiovascular: Normal rate, regular rhythm, normal heart sounds and intact distal pulses.   Pulses:      Radial pulses are 2+ on the right side, and 2+ on the left side.       Dorsalis pedis pulses are 2+ on the right side, and 2+ on the left side.       Posterior tibial pulses are 2+ on the right side, and 2+ on the left side.  Pulmonary/Chest: Effort normal.  Abdominal: Soft. Bowel sounds are normal. He exhibits no distension. There is no tenderness. There is no rebound.  Musculoskeletal: Normal range of motion. He exhibits no edema.  Neurological: He is alert and oriented to person, place, and time.  Skin: Skin is warm and dry. He is not diaphoretic.  Psychiatric: He has a normal mood and affect. His behavior is normal. Judgment and thought content normal.  Vitals reviewed.  BP (!) 155/99 (BP Location: Right Arm)   Pulse 62    Resp 16   Ht 6\' 4"  (1.93 m)   Wt 217 lb (98.4 kg)   BMI 26.41 kg/m   Past Medical History:  Diagnosis Date  . AAA (abdominal aortic aneurysm) ([place])   . Hyperlipidemia   . Hypertension   . 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 Marital status: Married    Spouse name: N/A  . Number of children: N/A  . Years of education: N/A   Occupational History  . Not on file.   Social History Main Topics  . Smoking status: Never Smoker  . Smokeless tobacco: Never Used  . Alcohol use Yes     Comment: 2 times weekly  . Drug use: No  . Sexual activity: Not on file   Other Topics Concern  . Not on file   Social History Narrative  . No narrative on file   Past Surgical History:  Procedure Laterality Date  . ACHILLES TENDON SURGERY    . HERNIA REPAIR    . KNEE SURGERY Right   . PERIPHERAL VASCULAR CATHETERIZATION N/A 06/07/2016   Procedure: Endovascular Repair/Stent Graft;  Surgeon: [name], MD;  Location: [facility];  Service: Cardiovascular;  Laterality: N/A;   Family History  Problem Relation Age of Onset  . Heart disease Mother   . Aortic aneurysm Mother   . Hyperlipidemia Father   . Prostate cancer  Neg Hx   . Bladder Cancer Neg Hx   . Kidney cancer Neg Hx    Allergies  Allergen Reactions  . Pollen Extract Shortness Of Breath and Itching      Assessment & Plan:  The patient presents for a three month AAA follow up. He is s/p endovascular AAA repair on 06/07/16. He underwent an aortic duplex which was notable for an abdominal aortic aneurysm measuring 8.02cm AP x 7.92cm transverse. No endoleak. This is a decrease in size when compared to a CT on 06/06/16. He denies any symptoms such as back pain, pulsatile abdominal masses or thrombosis in his extremities. His hypertension is adequately controlled (slightly high today however patient states he is normally not hypertensive).  1. AAA (abdominal aortic aneurysm) without  rupture (HCC) - Stable Studies reviewed with patient. Duplex stable with no leak, physical exam unremarkable. No surgery or intervention at this time. I have discussed with the patient at length the risk factors for and pathogenesis of atherosclerotic disease and encouraged a healthy diet, regular exercise regimen and blood pressure / glucose control. The patient is to follow up in six months for an aortic duplex with ABI's. Patient was instructed to contact our office with problems in the interim such back pain, pulsatile abdominal masses or thrombosis in her extremities, extremity pain or development of ulcerations.    - VAS [place] EVAR DUPLEX; Future - VAS [place] ABI WITH/WO TBI; Future  2. Pure hypercholesterolemia - Stable Encouraged good control as its slows the progression of atherosclerotic disease  3. Essential hypertension - Stable Encouraged good control as its slows the progression of atherosclerotic disease  Current Outpatient Prescriptions on File Prior to Visit  Medication Sig Dispense Refill  . amLODipine-valsartan (EXFORGE) 10-320 MG tablet Take 1 tablet by mouth daily.    [name] aspirin 81 MG tablet Take 81 mg by mouth daily.    [name] atorvastatin (LIPITOR) 40 MG tablet Take 1 tablet by mouth daily.    . fluticasone (FLONASE) 50 MCG/ACT nasal spray Place 1-2 sprays into the nose as needed for allergies.    [name] HYDROcodone-acetaminophen (NORCO/VICODIN) 5-325 MG tablet One to Two Tabs By Mouth Every Four to Six Hours As Needed For Pain 40 tablet 0  . Multiple Vitamins-Minerals (CENTRUM SILVER PO) Take 1 tablet by mouth daily.     No current facility-administered medications on file prior to visit.     There are no Patient Instructions on file for this visit. No Follow-up on file.   KIMBERLY A STEGMAYER, PA-C

## 2016-10-25 ENCOUNTER — Other Ambulatory Visit (INDEPENDENT_AMBULATORY_CARE_PROVIDER_SITE_OTHER): Payer: Medicare Other

## 2016-10-25 ENCOUNTER — Ambulatory Visit (INDEPENDENT_AMBULATORY_CARE_PROVIDER_SITE_OTHER): Payer: Medicare Other | Admitting: Vascular Surgery

## 2016-12-11 ENCOUNTER — Other Ambulatory Visit: Payer: Medicare Other

## 2016-12-11 DIAGNOSIS — R972 Elevated prostate specific antigen [PSA]: Secondary | ICD-10-CM

## 2016-12-12 LAB — PSA: PROSTATE SPECIFIC AG, SERUM: 2 ng/mL (ref 0.0–4.0)

## 2016-12-13 ENCOUNTER — Ambulatory Visit: Payer: Medicare Other | Admitting: Urology

## 2016-12-21 ENCOUNTER — Encounter: Payer: Self-pay | Admitting: Urology

## 2016-12-21 ENCOUNTER — Ambulatory Visit (INDEPENDENT_AMBULATORY_CARE_PROVIDER_SITE_OTHER): Payer: Medicare Other | Admitting: Urology

## 2016-12-21 VITALS — BP 127/75 | HR 70 | Ht 76.0 in | Wt 220.0 lb

## 2016-12-21 DIAGNOSIS — R972 Elevated prostate specific antigen [PSA]: Secondary | ICD-10-CM

## 2016-12-21 NOTE — Progress Notes (Signed)
12/21/2016 10:43 AM   Joe Pena 1950/10/27 [id number]  Referring provider: Kirk Ruths, MD [facility] [place], [place] [address]  Chief Complaint  Patient presents with  . Elevated PSA    82month    HPI: Pleasant 66 year old male who presents for follow-up of elevated PSA. He was seen 6 months ago on 12 2017 for an elevated PSA to 4.09. Prior to this, his PSA was in the 2 range. He does note that he had sexual intercourse just before having his last PSA drawn.  Today, his PSA is back down to 2.0, his baseline.   He has no significant voiding complaints. He gets up 3 times a night to urinate but this is not bothersome to him. He takes no BPH medications. No history of UTIs. No gross hematuria.   PMH: Past Medical History:  Diagnosis Date  . AAA (abdominal aortic aneurysm) ([place])   . Hyperlipidemia   . Hypertension   . 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 ACHILLES TENDON SURGERY    . HERNIA REPAIR    . KNEE SURGERY Right   . PERIPHERAL VASCULAR CATHETERIZATION N/A 06/07/2016   Procedure: Endovascular Repair/Stent Graft;  Surgeon: [name], MD;  Location: [facility];  Service: Cardiovascular;  Laterality: N/A;    Home Medications:  Allergies as of 12/21/2016      Reactions   Pollen Extract Shortness Of Breath, Itching      Medication List       Accurate as of 12/21/16 10:43 AM. Always use your most recent med list.          albuterol 108 (90 Base) MCG/ACT inhaler Commonly known as:  PROVENTIL HFA;VENTOLIN HFA Inhale into the lungs.   amLODipine-valsartan 10-320 MG tablet Commonly known as:  EXFORGE Take 1 tablet by mouth daily.   aspirin 81 MG tablet Take 81 mg by mouth daily.   atorvastatin 40 MG tablet Commonly known as:  LIPITOR Take 1 tablet by mouth daily.   CENTRUM SILVER PO Take 1 tablet by mouth daily.     cetirizine 10 MG tablet Commonly known as:  ZYRTEC Take by mouth.   fluticasone 50 MCG/ACT nasal spray Commonly known as:  FLONASE Place 1-2 sprays into the nose as needed for allergies.   Folic Acid-Vit U2-PNT I14 0.8-10-0.115 MG Tabs Take by mouth.   omeprazole 20 MG capsule Commonly known as:  PRILOSEC Take by mouth.       Allergies:  Allergies  Allergen Reactions  . Pollen Extract Shortness Of Breath and Itching    Family History: Family History  Problem Relation Age of Onset  . Heart disease Mother   . Aortic aneurysm Mother   . Hyperlipidemia Father   . Prostate cancer Neg Hx   . Bladder Cancer Neg Hx   . Kidney cancer Neg Hx     Social History:  reports that he has never smoked. He has never used smokeless tobacco. He reports that he drinks alcohol. He reports that he does not use drugs.  ROS: UROLOGY Frequent Urination?: No Hard to postpone urination?: No Burning/pain with urination?: No Get up at night to urinate?: No Leakage of urine?: No Urine stream starts and stops?: No Trouble starting stream?: No Do you have to strain to urinate?: No Blood in urine?: No Urinary tract infection?: No Sexually transmitted disease?: No Injury to kidneys or bladder?: No  Painful intercourse?: No Weak stream?: No Erection problems?: No Penile pain?: No  Gastrointestinal Nausea?: No Vomiting?: No Indigestion/heartburn?: No Diarrhea?: No Constipation?: No  Constitutional Fever: No Night sweats?: No Weight loss?: No Fatigue?: No  Skin Skin rash/lesions?: No Itching?: No  Eyes Blurred vision?: No Double vision?: No  Ears/Nose/Throat Sore throat?: No Sinus problems?: No  Hematologic/Lymphatic Swollen glands?: No Easy bruising?: No  Cardiovascular Leg swelling?: No Chest pain?: No  Respiratory Cough?: No Shortness of breath?: No  Endocrine Excessive thirst?: No  Musculoskeletal Back pain?: No Joint pain?:  No  Neurological Headaches?: No Dizziness?: No  Psychologic Depression?: No Anxiety?: No  Physical Exam: BP 127/75   Pulse 70   Ht 6\' 4"  (1.93 m)   Wt 220 lb (99.8 kg)   BMI 26.78 kg/m   Constitutional:  Alert and oriented, No acute distress.  Accompanied by wife today. HEENT: [place] AT, moist mucus membranes.  Trachea midline, no masses. Cardiovascular: No clubbing, cyanosis, or edema. Respiratory: Normal respiratory effort, no increased work of breathing.  Rectal exam deferred today, previously 2+ in December/2017 without nodules. Skin: No rashes, bruises or suspicious lesions. Neurologic: Grossly intact, no focal deficits, moving all 4 extremities. Psychiatric: Normal mood and affect.  Laboratory Data: Lab Results  Component Value Date   WBC 12.2 (H) 06/08/2016   HGB 12.5 (L) 06/08/2016   HCT 37.0 (L) 06/08/2016   MCV 91.3 06/08/2016   PLT 173 06/08/2016    Lab Results  Component Value Date   CREATININE 1.40 (H) 06/08/2016   Component     Latest Ref Rng & Units 06/13/2016 12/11/2016  PSA     0.0 - 4.0 ng/mL 3.1 2.0    Urinalysis    Component Value Date/Time   COLORURINE Amber 09/29/2013 1855   APPEARANCEUR Hazy 09/29/2013 1855   LABSPEC 1.028 09/29/2013 1855   PHURINE 5.0 09/29/2013 1855   GLUCOSEU Negative 09/29/2013 1855   HGBUR Negative 09/29/2013 1855   BILIRUBINUR Negative 09/29/2013 1855   KETONESUR Negative 09/29/2013 1855   PROTEINUR 30 mg/dL 09/29/2013 1855   NITRITE Negative 09/29/2013 1855   LEUKOCYTESUR Negative 09/29/2013 1855    Pertinent Imaging: n/a  Assessment & Plan:    1. Elevated PSA Most recent PSA back down to baseline around 2.0 Suspect PSA 6 months ago which was significantly elevated above his baseline was related to sexual activity  We discussed today that differential for elevated PSA is BPH, prostate cancer, infection, recent intercourse/ejaculation, prostate infarction, recent urethroscopic manipulation (foley  placement/cystoscopy) and prostatitis in unfortunately is not very specific. In the future, he is advised to abstain from sexual activity at least 48 hours prior blood. Given his overall health, I would recommend he see screening into the age of 76-75 per guidelines.  He'll be discharged back to his primary care physician for further  screening as his PSA has normalized. He is agreeable to this plan.   He'll follow up as needed.  Hollice Espy, MD  [facility] [address] [place], [place] [address] [phone]

## 2017-03-30 ENCOUNTER — Ambulatory Visit (INDEPENDENT_AMBULATORY_CARE_PROVIDER_SITE_OTHER): Payer: Medicare Other

## 2017-03-30 ENCOUNTER — Ambulatory Visit (INDEPENDENT_AMBULATORY_CARE_PROVIDER_SITE_OTHER): Payer: Medicare Other | Admitting: Vascular Surgery

## 2017-03-30 ENCOUNTER — Encounter (INDEPENDENT_AMBULATORY_CARE_PROVIDER_SITE_OTHER): Payer: Self-pay | Admitting: Vascular Surgery

## 2017-03-30 VITALS — BP 150/93 | HR 60 | Resp 16 | Ht 76.0 in | Wt 220.0 lb

## 2017-03-30 DIAGNOSIS — I714 Abdominal aortic aneurysm, without rupture, unspecified: Secondary | ICD-10-CM

## 2017-03-30 DIAGNOSIS — E7800 Pure hypercholesterolemia, unspecified: Secondary | ICD-10-CM | POA: Diagnosis not present

## 2017-03-30 DIAGNOSIS — I10 Essential (primary) hypertension: Secondary | ICD-10-CM | POA: Diagnosis not present

## 2017-03-30 NOTE — Progress Notes (Signed)
MRN : [id number]  Joe Pena is a 66 y.o. ([birth year]) male who presents with chief complaint of AAA.  History of Present Illness: Patient returns today in follow up of abdominal aortic aneurysm status post repair in November 2017. He was last seen 6 months ago. Patient presents today without complaint. He denies any abdominal pain, back pain, claudication-like symptoms, rest pain or ulceration to the lower extremity. The patient underwent an abdominal aortic duplex exam status post endovascular repair which was notable for a patent endovascular repair of the abdominal aorta without any evidence of endovascular leak. Abdominal aortic aneurysm repair measuring 7.9 cm 7.8 cm. Compared to the previous exam on 09/27/2016 there has been a decrease in the aneurysmal sac.the patient underwent a bilateral ABI which was notable for no significant lower extremity arterial disease.  Current Outpatient Prescriptions  Medication Sig Dispense Refill  . amLODipine-valsartan (EXFORGE) 10-320 MG tablet Take 1 tablet by mouth daily.    [name] aspirin 81 MG tablet Take 81 mg by mouth daily.    [name] atorvastatin (LIPITOR) 40 MG tablet Take 1 tablet by mouth daily.    . fluticasone (FLONASE) 50 MCG/ACT nasal spray Place 1-2 sprays into the nose as needed for allergies.    . Folic Acid-Vit N3-IRW E31 0.8-10-0.115 MG TABS Take by mouth.    . Multiple Vitamins-Minerals (CENTRUM SILVER PO) Take 1 tablet by mouth daily.    [name] albuterol (PROVENTIL HFA;VENTOLIN HFA) 108 (90 Base) MCG/ACT inhaler Inhale into the lungs.    . cetirizine (ZYRTEC) 10 MG tablet Take by mouth.    [name] omeprazole (PRILOSEC) 20 MG capsule Take by mouth.     No current facility-administered medications for this visit.    Past Medical History:  Diagnosis Date  . AAA (abdominal aortic aneurysm) ([place])   . Hyperlipidemia   . Hypertension   . 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 ACHILLES TENDON SURGERY     . HERNIA REPAIR    . KNEE SURGERY Right   . PERIPHERAL VASCULAR CATHETERIZATION N/A 06/07/2016   Procedure: Endovascular Repair/Stent Graft;  Surgeon: [name], MD;  Location: [facility];  Service: Cardiovascular;  Laterality: N/A;   Social History Social History  Substance Use Topics  . Smoking status: Never Smoker  . Smokeless tobacco: Never Used  . Alcohol use Yes     Comment: 2 times weekly   Family History Family History  Problem Relation Age of Onset  . Heart disease Mother   . Aortic aneurysm Mother   . Hyperlipidemia Father   . Prostate cancer Neg Hx   . Bladder Cancer Neg Hx   . Kidney cancer Neg Hx    Allergies  Allergen Reactions  . Pollen Extract Shortness Of Breath and Itching   REVIEW OF SYSTEMS (Negative unless checked)  Constitutional: [] Weight loss  [] Fever  [] Chills Cardiac: [] Chest pain   [] Chest pressure   [] Palpitations   [] Shortness of breath when laying flat   [] Shortness of breath at rest   [] Shortness of breath with exertion. Vascular:  [] Pain in legs with walking   [] Pain in legs at rest   [] Pain in legs when laying flat   [] Claudication   [] Pain in feet when walking  [] Pain in feet at rest  [] Pain in feet when laying flat   [] History of DVT   [] Phlebitis   [] Swelling in legs   [] Varicose veins   [] Non-healing ulcers Pulmonary:   []   Uses home oxygen   [] Productive cough   [] Hemoptysis   [] Wheeze  [] COPD   [] Asthma Neurologic:  [] Dizziness  [] Blackouts   [] Seizures   [] History of stroke   [] History of TIA  [] Aphasia   [] Temporary blindness   [] Dysphagia   [] Weakness or numbness in arms   [] Weakness or numbness in legs Musculoskeletal:  [] Arthritis   [] Joint swelling   [] Joint pain   [] Low back pain Hematologic:  [] Easy bruising  [] Easy bleeding   [] Hypercoagulable state   [] Anemic   Gastrointestinal:  [] Blood in stool   [] Vomiting blood  [] Gastroesophageal reflux/heartburn   [] Abdominal pain Genitourinary:  [] Chronic kidney disease    [] Difficult urination  [] Frequent urination  [] Burning with urination   [] Hematuria Skin:  [] Rashes   [] Ulcers   [] Wounds Psychological:  [] History of anxiety   []  History of major depression.  Review of systems is negative  Physical Examination  BP (!) 150/93 (BP Location: Right Arm)   Pulse 60   Resp 16   Ht 6\' 4"  (1.93 m)   Wt 220 lb (99.8 kg)   BMI 26.78 kg/m  Gen:  WD/WN, NAD Head: [place]/AT, No temporalis wasting. Ear/Nose/Throat: Hearing grossly intact, nares w/o erythema or drainage, trachea midline Eyes: Conjunctiva clear. Sclera non-icteric Neck: Supple.  No JVD.  Pulmonary:  Good air movement, no use of accessory muscles.  Cardiac: RRR, normal S1, S2 Vascular:  Vessel Right Left  Radial Palpable Palpable  Ulnar Palpable Palpable  Brachial Palpable Palpable  Carotid Palpable, without bruit Palpable, without bruit  Aorta Not palpable N/A  Femoral Palpable Palpable  Popliteal Palpable Palpable  PT Palpable Palpable  DP Palpable Palpable   Gastrointestinal: soft, non-tender/non-distended. No guarding/reflex.  Musculoskeletal: M/S 5/5 throughout.  No deformity or atrophy. No edema. Neurologic: Sensation grossly intact in extremities.  Symmetrical.  Speech is fluent.  Psychiatric: Judgment intact, Mood & affect appropriate for pt's clinical situation. Dermatologic: No rashes or ulcers noted.  No cellulitis or open wounds. Lymph : No Cervical, Axillary, or Inguinal lymphadenopathy.  Labs No results found for this or any previous visit (from the past 2160 hour(s)).  Radiology No results found.  Assessment/Plan The patient is a 66 year old male status post an endovascular repair of an abdominal aortic aneurysm in November 2017. He presents today without complaint.  1) Abdominal aortic aneurysms s/p repair - Stable Patent endograft without leak noted on duplex Decrease in aneurysmal sac when compared to previous exam in April 2018. No intervention is indicated at  this time Patient encouraged to follow up in one year with an Marylouise Stacks an ABI.  2) Hypertension: Stable Encouraged good control as its slows the progression of atherosclerotic disease  3) Pure Hypercholesterolemia - Stable Encouraged good control as its slows the progression of atherosclerotic disease  KIMBERLY A STEGMAYER, PA-C  03/30/2017 10:56 AM  This note was created with Dragon medical transcription system.  Any errors from dictation are purely unintentional

## 2017-08-13 ENCOUNTER — Ambulatory Visit: Payer: Medicare Other | Admitting: Anesthesiology

## 2017-08-13 ENCOUNTER — Ambulatory Visit
Admission: RE | Admit: 2017-08-13 | Discharge: 2017-08-13 | Disposition: A | Discharge status: Home / Self Care | Payer: Medicare Other | Source: Ambulatory Visit | Attending: Gastroenterology | Admitting: Gastroenterology

## 2017-08-13 ENCOUNTER — Encounter
Admission: RE | Disposition: A | Discharge status: Home / Self Care | Payer: Self-pay | Source: Ambulatory Visit | Attending: Gastroenterology

## 2017-08-13 ENCOUNTER — Encounter: Payer: Self-pay | Admitting: Anesthesiology

## 2017-08-13 DIAGNOSIS — D125 Benign neoplasm of sigmoid colon: Secondary | ICD-10-CM | POA: Diagnosis not present

## 2017-08-13 DIAGNOSIS — K640 First degree hemorrhoids: Secondary | ICD-10-CM | POA: Insufficient documentation

## 2017-08-13 DIAGNOSIS — D123 Benign neoplasm of transverse colon: Secondary | ICD-10-CM | POA: Diagnosis not present

## 2017-08-13 DIAGNOSIS — Z7951 Long term (current) use of inhaled steroids: Secondary | ICD-10-CM | POA: Diagnosis not present

## 2017-08-13 DIAGNOSIS — Z1211 Encounter for screening for malignant neoplasm of colon: Secondary | ICD-10-CM | POA: Diagnosis present

## 2017-08-13 DIAGNOSIS — Z79899 Other long term (current) drug therapy: Secondary | ICD-10-CM | POA: Diagnosis not present

## 2017-08-13 DIAGNOSIS — K5730 Diverticulosis of large intestine without perforation or abscess without bleeding: Secondary | ICD-10-CM | POA: Insufficient documentation

## 2017-08-13 DIAGNOSIS — E785 Hyperlipidemia, unspecified: Secondary | ICD-10-CM | POA: Insufficient documentation

## 2017-08-13 DIAGNOSIS — I739 Peripheral vascular disease, unspecified: Secondary | ICD-10-CM | POA: Insufficient documentation

## 2017-08-13 DIAGNOSIS — I10 Essential (primary) hypertension: Secondary | ICD-10-CM | POA: Insufficient documentation

## 2017-08-13 DIAGNOSIS — Z7982 Long term (current) use of aspirin: Secondary | ICD-10-CM | POA: Diagnosis not present

## 2017-08-13 DIAGNOSIS — D124 Benign neoplasm of descending colon: Secondary | ICD-10-CM | POA: Diagnosis not present

## 2017-08-13 DIAGNOSIS — Z86010 Personal history of colonic polyps: Secondary | ICD-10-CM | POA: Diagnosis not present

## 2017-08-13 HISTORY — PX: COLONOSCOPY WITH PROPOFOL: SHX5780

## 2017-08-13 HISTORY — DX: Polyp of colon: K63.5

## 2017-08-13 SURGERY — COLONOSCOPY WITH PROPOFOL
Anesthesia: General

## 2017-08-13 MED ORDER — SODIUM CHLORIDE 0.9 % IV SOLN: INTRAVENOUS | Status: DC

## 2017-08-13 MED ORDER — PROPOFOL 500 MG/50ML IV EMUL
INTRAVENOUS | Status: AC
  Filled 2017-08-13: qty 50

## 2017-08-13 MED ORDER — LIDOCAINE HCL (PF) 1 % IJ SOLN
2.0000 mL | Freq: Once | INTRAMUSCULAR | Status: AC
  Administered 2017-08-13: 0.3 mL via INTRADERMAL

## 2017-08-13 MED ORDER — FENTANYL CITRATE (PF) 100 MCG/2ML IJ SOLN
INTRAMUSCULAR | Status: DC | PRN
  Administered 2017-08-13: 50 ug via INTRAVENOUS

## 2017-08-13 MED ORDER — MIDAZOLAM HCL 2 MG/2ML IJ SOLN
INTRAMUSCULAR | Status: AC
  Filled 2017-08-13: qty 2

## 2017-08-13 MED ORDER — PHENYLEPHRINE HCL 10 MG/ML IJ SOLN
INTRAMUSCULAR | Status: AC
  Filled 2017-08-13: qty 1

## 2017-08-13 MED ORDER — PROPOFOL 500 MG/50ML IV EMUL
INTRAVENOUS | Status: DC | PRN
  Administered 2017-08-13: 120 ug/kg/min via INTRAVENOUS

## 2017-08-13 MED ORDER — LIDOCAINE HCL (PF) 1 % IJ SOLN
INTRAMUSCULAR | Status: AC
  Administered 2017-08-13: 0.3 mL via INTRADERMAL
  Filled 2017-08-13: qty 2

## 2017-08-13 MED ORDER — SODIUM CHLORIDE 0.9 % IV SOLN
INTRAVENOUS | Status: DC
  Administered 2017-08-13: 1000 mL via INTRAVENOUS

## 2017-08-13 MED ORDER — PHENYLEPHRINE HCL 10 MG/ML IJ SOLN
INTRAMUSCULAR | Status: DC | PRN
  Administered 2017-08-13 (×3): 50 ug via INTRAVENOUS

## 2017-08-13 MED ORDER — EPHEDRINE SULFATE 50 MG/ML IJ SOLN
INTRAMUSCULAR | Status: DC | PRN
  Administered 2017-08-13: 5 mg via INTRAVENOUS
  Administered 2017-08-13: 10 mg via INTRAVENOUS

## 2017-08-13 MED ORDER — MIDAZOLAM HCL 2 MG/2ML IJ SOLN
INTRAMUSCULAR | Status: DC | PRN
  Administered 2017-08-13: 2 mg via INTRAVENOUS

## 2017-08-13 MED ORDER — FENTANYL CITRATE (PF) 100 MCG/2ML IJ SOLN
INTRAMUSCULAR | Status: AC
  Filled 2017-08-13: qty 2

## 2017-08-13 MED ORDER — EPHEDRINE SULFATE 50 MG/ML IJ SOLN
INTRAMUSCULAR | Status: AC
  Filled 2017-08-13: qty 1

## 2017-08-13 NOTE — H&P (Signed)
Outpatient short stay form Pre-procedure 08/13/2017 1:20 PM Lollie Sails MD  Primary Physician: Dr. Frazier Richards  Reason for visit: Colonoscopy  History of present illness: Patient is a 67 year old male presenting today as above.  He has personal history of adenomatous colon polyps.  He tolerated his prep well.  He does take a daily 81 mg aspirin.  He takes no other blood thinners or aspirin products.  Of note patient has a history of a endovascular abdominal aortic aneurysm repair in November 2017.  He has done well with that since.    Current Facility-Administered Medications:  .  0.9 %  sodium chloride infusion, , Intravenous, Continuous, Lollie Sails, MD, Last Rate: 20 mL/hr at 08/13/17 1245, 1,000 mL at 08/13/17 1245 .  0.9 %  sodium chloride infusion, , Intravenous, Continuous, Lollie Sails, MD  Medications Prior to Admission  Medication Sig Dispense Refill Last Dose  . amLODipine (NORVASC) 10 MG tablet Take 10 mg by mouth daily.   08/13/2017 at 0600  . losartan (COZAAR) 100 MG tablet Take 100 mg by mouth daily.   08/13/2017 at 0600  . albuterol (PROVENTIL HFA;VENTOLIN HFA) 108 (90 Base) MCG/ACT inhaler Inhale into the lungs.   Taking  . amLODipine-valsartan (EXFORGE) 10-320 MG tablet Take 1 tablet by mouth daily.   Taking  . aspirin 81 MG tablet Take 81 mg by mouth daily.   08/06/2017  . atorvastatin (LIPITOR) 40 MG tablet Take 1 tablet by mouth daily.   Taking  . cetirizine (ZYRTEC) 10 MG tablet Take by mouth.   Not Taking  . fluticasone (FLONASE) 50 MCG/ACT nasal spray Place 1-2 sprays into the nose as needed for allergies.   Taking  . Folic Acid-Vit K2-HCW C37 0.8-10-0.115 MG TABS Take by mouth.   Taking  . Multiple Vitamins-Minerals (CENTRUM SILVER PO) Take 1 tablet by mouth daily.   Taking  . omeprazole (PRILOSEC) 20 MG capsule Take by mouth.   Taking     Allergies  Allergen Reactions  . Pollen Extract Shortness Of Breath and Itching     Past  Medical History:  Diagnosis Date  . AAA (abdominal aortic aneurysm) ([place])   . Colon polyps   . Hyperlipidemia   . Hypertension   . 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 I have discussed the risks benefits and complications of procedures to include not limited to bleeding, infection, perforation and the risk of sedation and the patient wishes to proceed.    Lollie Sails, MD Gastroenterology 08/13/2017  1:20 PM

## 2017-08-13 NOTE — Anesthesia Post-op Follow-up Note (Signed)
Anesthesia QCDR form completed.        

## 2017-08-13 NOTE — Op Note (Signed)
[facility] Gastroenterology Patient Name: Joe Pena Procedure Date: 08/13/2017 1:21 PM MRN: [id number] Account #: [id number] Date of Birth: [birth year] Admit Type: Outpatient Age: 67 Room: [facility] ENDO ROOM 3 Gender: Male Note Status: Finalized Procedure:            Colonoscopy Indications:          Personal history of colonic polyps Providers:            Lollie Sails, MD Referring MD:         Ocie Cornfield. Ouida Sills MD, MD (Referring MD) Medicines:            Monitored Anesthesia Care Complications:        No immediate complications. Procedure:            Pre-Anesthesia Assessment:                       - ASA Grade Assessment: III - A patient with severe                        systemic disease.                       After obtaining informed consent, the colonoscope was                        passed under direct vision. Throughout the procedure,                        the patient's blood pressure, pulse, and oxygen                        saturations were monitored continuously. The Olympus                        PCF-H180AL colonoscope ( S#: [id number] ) was introduced                        through the anus and advanced to the the cecum,                        identified by appendiceal orifice and ileocecal valve.                        The colonoscopy was performed without difficulty. The                        patient tolerated the procedure well. The quality of                        the bowel preparation was fair. Findings:      Multiple small-mouthed diverticula were found in the sigmoid colon and       descending colon.      A 2 mm polyp was found in the transverse colon. The polyp was sessile.       The polyp was removed with a cold biopsy forceps. Resection and       retrieval were complete.      Two sessile polyps were found in the descending colon. The polyps were 3       to 4 mm in size. These polyps were removed with a  cold snare. Resection       and  retrieval were complete.      A 2 mm polyp was found in the distal sigmoid colon. The polyp was       sessile. The polyp was removed with a cold biopsy forceps. Resection and       retrieval were complete.      Non-bleeding internal hemorrhoids were found during retroflexion. The       hemorrhoids were small and Grade I (internal hemorrhoids that do not       prolapse).      No additional abnormalities were found on retroflexion. Impression:           - Preparation of the colon was fair.                       - Diverticulosis in the sigmoid colon and in the                        descending colon.                       - One 2 mm polyp in the transverse colon, removed with                        a cold biopsy forceps. Resected and retrieved.                       - Two 3 to 4 mm polyps in the descending colon, removed                        with a cold snare. Resected and retrieved.                       - One 2 mm polyp in the distal sigmoid colon, removed                        with a cold biopsy forceps. Resected and retrieved.                       - Non-bleeding internal hemorrhoids. Recommendation:       - Discharge patient to home.                       - Await pathology results.                       - Telephone GI clinic for pathology results in 1 week. Procedure Code(s):    --- Professional ---                       [phone], Colonoscopy, flexible; with removal of tumor(s),                        polyp(s), or other lesion(s) by snare technique                       74259, 35, Colonoscopy, flexible; with biopsy, single                        or multiple Diagnosis Code(s):    --- Professional ---  K64.0, First degree hemorrhoids                       D12.3, Benign neoplasm of transverse colon (hepatic                        flexure or splenic flexure)                       D12.5, Benign neoplasm of sigmoid colon                       D12.4, Benign neoplasm of  descending colon                       Z86.010, Personal history of colonic polyps                       K57.30, Diverticulosis of large intestine without                        perforation or abscess without bleeding CPT copyright 2016 American Medical Association. All rights reserved. The codes documented in this report are preliminary and upon coder review may  be revised to meet current compliance requirements. Lollie Sails, MD 08/13/2017 2:21:19 PM This report has been signed electronically. Number of Addenda: 0 Note Initiated On: 08/13/2017 1:21 PM Scope Withdrawal Time: 0 hours 18 minutes 21 seconds  Total Procedure Duration: 0 hours 37 minutes 54 seconds       [facility]

## 2017-08-13 NOTE — Anesthesia Procedure Notes (Signed)
Performed by: Cook-Martin, Jefferey Lippmann Pre-anesthesia Checklist: Patient identified, Emergency Drugs available, Suction available, Patient being monitored and Timeout performed Patient Re-evaluated:Patient Re-evaluated prior to induction Oxygen Delivery Method: Nasal cannula Preoxygenation: Pre-oxygenation with 100% oxygen Induction Type: IV induction Placement Confirmation: positive ETCO2 and CO2 detector       

## 2017-08-13 NOTE — Anesthesia Preprocedure Evaluation (Addendum)
Anesthesia Evaluation  Patient identified by MRN, date of birth, ID band Patient awake    Reviewed: Allergy & Precautions, NPO status , Patient's Chart, lab work & pertinent test results, reviewed documented beta blocker date and time   Airway Mallampati: III  TM Distance: >3 FB     Dental  (+) Chipped, Missing   Pulmonary           Cardiovascular hypertension, Pt. on medications + Peripheral Vascular Disease       Neuro/Psych    GI/Hepatic   Endo/Other    Renal/GU      Musculoskeletal   Abdominal   Peds  Hematology   Anesthesia Other Findings AAA repair.  Reproductive/Obstetrics                            Anesthesia Physical Anesthesia Plan  ASA: III  Anesthesia Plan: General   Post-op Pain Management:    Induction: Intravenous  PONV Risk Score and Plan:   Airway Management Planned:   Additional Equipment:   Intra-op Plan:   Post-operative Plan:   Informed Consent: I have reviewed the patients History and Physical, chart, labs and discussed the procedure including the risks, benefits and alternatives for the proposed anesthesia with the patient or authorized representative who has indicated his/her understanding and acceptance.     Plan Discussed with: CRNA  Anesthesia Plan Comments:         Anesthesia Quick Evaluation

## 2017-08-13 NOTE — Transfer of Care (Signed)
Immediate Anesthesia Transfer of Care Note  Patient: Joe Pena  Procedure(s) Performed: COLONOSCOPY WITH PROPOFOL (N/A )  Patient Location: PACU  Anesthesia Type:General  Level of Consciousness: awake and sedated  Airway & Oxygen Therapy: Patient Spontanous Breathing and Patient connected to nasal cannula oxygen  Post-op Assessment: Report given to RN and Post -op Vital signs reviewed and stable  Post vital signs: Reviewed and stable  Last Vitals:  Vitals:   08/13/17 1228  BP: 119/80  Pulse: 71  Resp: 17  Temp: (!) 36.1 C  SpO2: 100%    Last Pain: There were no vitals filed for this visit.       Complications: No apparent anesthesia complications

## 2017-08-13 NOTE — Anesthesia Postprocedure Evaluation (Signed)
Anesthesia Post Note  Patient: Joe Pena  Procedure(s) Performed: COLONOSCOPY WITH PROPOFOL (N/A )  Patient location during evaluation: Endoscopy Anesthesia Type: General Level of consciousness: awake and alert Pain management: pain level controlled Vital Signs Assessment: post-procedure vital signs reviewed and stable Respiratory status: spontaneous breathing, nonlabored ventilation, respiratory function stable and patient connected to nasal cannula oxygen Cardiovascular status: blood pressure returned to baseline and stable Postop Assessment: no apparent nausea or vomiting Anesthetic complications: no     Last Vitals:  Vitals:   08/13/17 1228 08/13/17 1422  BP: 119/80 110/73  Pulse: 71   Resp: 17   Temp: (!) 36.1 C (!) 36.1 C  SpO2: 100%     Last Pain:  Vitals:   08/13/17 1452  TempSrc:   PainSc: 0-No pain                 Jamar Weatherall S

## 2017-08-14 ENCOUNTER — Encounter: Payer: Self-pay | Admitting: Gastroenterology

## 2017-08-15 LAB — SURGICAL PATHOLOGY

## 2017-08-17 ENCOUNTER — Ambulatory Visit (INDEPENDENT_AMBULATORY_CARE_PROVIDER_SITE_OTHER): Payer: Medicare Other | Admitting: Internal Medicine

## 2017-08-17 ENCOUNTER — Encounter: Payer: Self-pay | Admitting: Internal Medicine

## 2017-08-17 VITALS — BP 130/86 | HR 65 | Ht 76.0 in | Wt 218.0 lb

## 2017-08-17 DIAGNOSIS — R55 Syncope and collapse: Secondary | ICD-10-CM

## 2017-08-17 NOTE — Progress Notes (Signed)
HPI Joe Pena returns for ongoing evaluation of syncope. He is a pleasant otherwise healthy 67 yo man with dyslipidemia. He presented initially with syncope several months ago. His episodes were typical and consistent with autonomic dysfunction/vasomotor syncope. The patient subsequently was found to have a large abdominal aneurysm and had it repaired endovascularly. He has had 3 episodes since, of which only one was associated with passing out. The other 2 he was able to lie down and eventually the spells passed. The episodes are classic for vasomotor syncope. Allergies  Allergen Reactions  . Pollen Extract Shortness Of Breath and Itching     Current Outpatient Medications  Medication Sig Dispense Refill  . albuterol (PROVENTIL HFA;VENTOLIN HFA) 108 (90 Base) MCG/ACT inhaler Inhale into the lungs.    [name] amLODipine (NORVASC) 10 MG tablet Take 10 mg by mouth daily.    [name] amLODipine-valsartan (EXFORGE) 10-320 MG tablet Take 1 tablet by mouth daily.    [name] aspirin 81 MG tablet Take 81 mg by mouth daily.    [name] atorvastatin (LIPITOR) 40 MG tablet Take 1 tablet by mouth daily.    . cetirizine (ZYRTEC) 10 MG tablet Take by mouth.    . fluticasone (FLONASE) 50 MCG/ACT nasal spray Place 1-2 sprays into the nose as needed for allergies.    . Folic Acid-Vit C1-EXN T70 0.8-10-0.115 MG TABS Take by mouth.    . losartan (COZAAR) 100 MG tablet Take 100 mg by mouth daily.    . Multiple Vitamins-Minerals (CENTRUM SILVER PO) Take 1 tablet by mouth daily.    [name] omeprazole (PRILOSEC) 20 MG capsule Take by mouth.     No current facility-administered medications for this visit.      Past Medical History:  Diagnosis Date  . AAA (abdominal aortic aneurysm) ([place])   . Colon polyps   . Hyperlipidemia   . Hypertension   . 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   Past Surgical History:  Procedure Laterality Date  . ACHILLES TENDON  SURGERY    . COLONOSCOPY    . COLONOSCOPY WITH PROPOFOL N/A 08/13/2017   Procedure: COLONOSCOPY WITH PROPOFOL;  Surgeon: Lollie Sails, MD;  Location: [facility] ENDOSCOPY;  Service: Endoscopy;  Laterality: N/A;  . HERNIA REPAIR    . KNEE SURGERY Right   . lens repair    . PERIPHERAL VASCULAR CATHETERIZATION N/A 06/07/2016   Procedure: Endovascular Repair/Stent Graft;  Surgeon: [name], MD;  Location: [place] CV LAB;  Service: Cardiovascular;  Laterality: N/A;     Family History  Problem Relation Age of Onset  . Heart disease Mother   . Aortic aneurysm Mother   . Hyperlipidemia Father   . Prostate cancer Neg Hx   . Bladder Cancer Neg Hx   . Kidney cancer Neg Hx      Social History   Socioeconomic History  . Marital status: Married    Spouse name: Not on file  . Number of children: Not on file  . Years of education: Not on file  . Highest education level: Not on file  Social Needs  . Financial resource strain: Not on file  . Food insecurity - worry: Not on file  . Food insecurity - inability: Not on file  . Transportation needs - medical: Not on file  . Transportation needs - non-medical: Not on file  Occupational History  . Not on file  Tobacco  Use  . Smoking status: Never Smoker  . Smokeless tobacco: Never Used  Substance and Sexual Activity  . Alcohol use: Yes    Comment: 2 times weekly  . Drug use: No  . Sexual activity: Not on file  Other Topics Concern  . Not on file  Social History Narrative  . 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 I have asked him to increase his fluid intake, and to not miss any meals. No additional medical therapy at this time. 2. Dyslipidemia - he will continue lipitor.  3. AAA - he is s/p endovascular repair and is asymptomatic at this time. 4. HTN - this limits the amount of salt that he can consume. He will continue his current meds.  [name].D.

## 2017-08-17 NOTE — Patient Instructions (Signed)

## 2017-08-23 ENCOUNTER — Encounter: Payer: Self-pay | Admitting: Cardiology

## 2018-02-22 IMAGING — CR DG CHEST 2V
1 series · 2 of 2 positions shown · non-contrast
Comparison: PA and lateral chest x-ray September 29, 2013

CLINICAL DATA: Urgent abdominal aortic aneurysm repair tomorrow.
History of hypertension, hyperlipidemia, never smoked.

EXAM:
CHEST  2 VIEW

[Series 1: dg chest 2 view · 0.14mm/px · 2 of 2 slices shown]
[im 1/2]
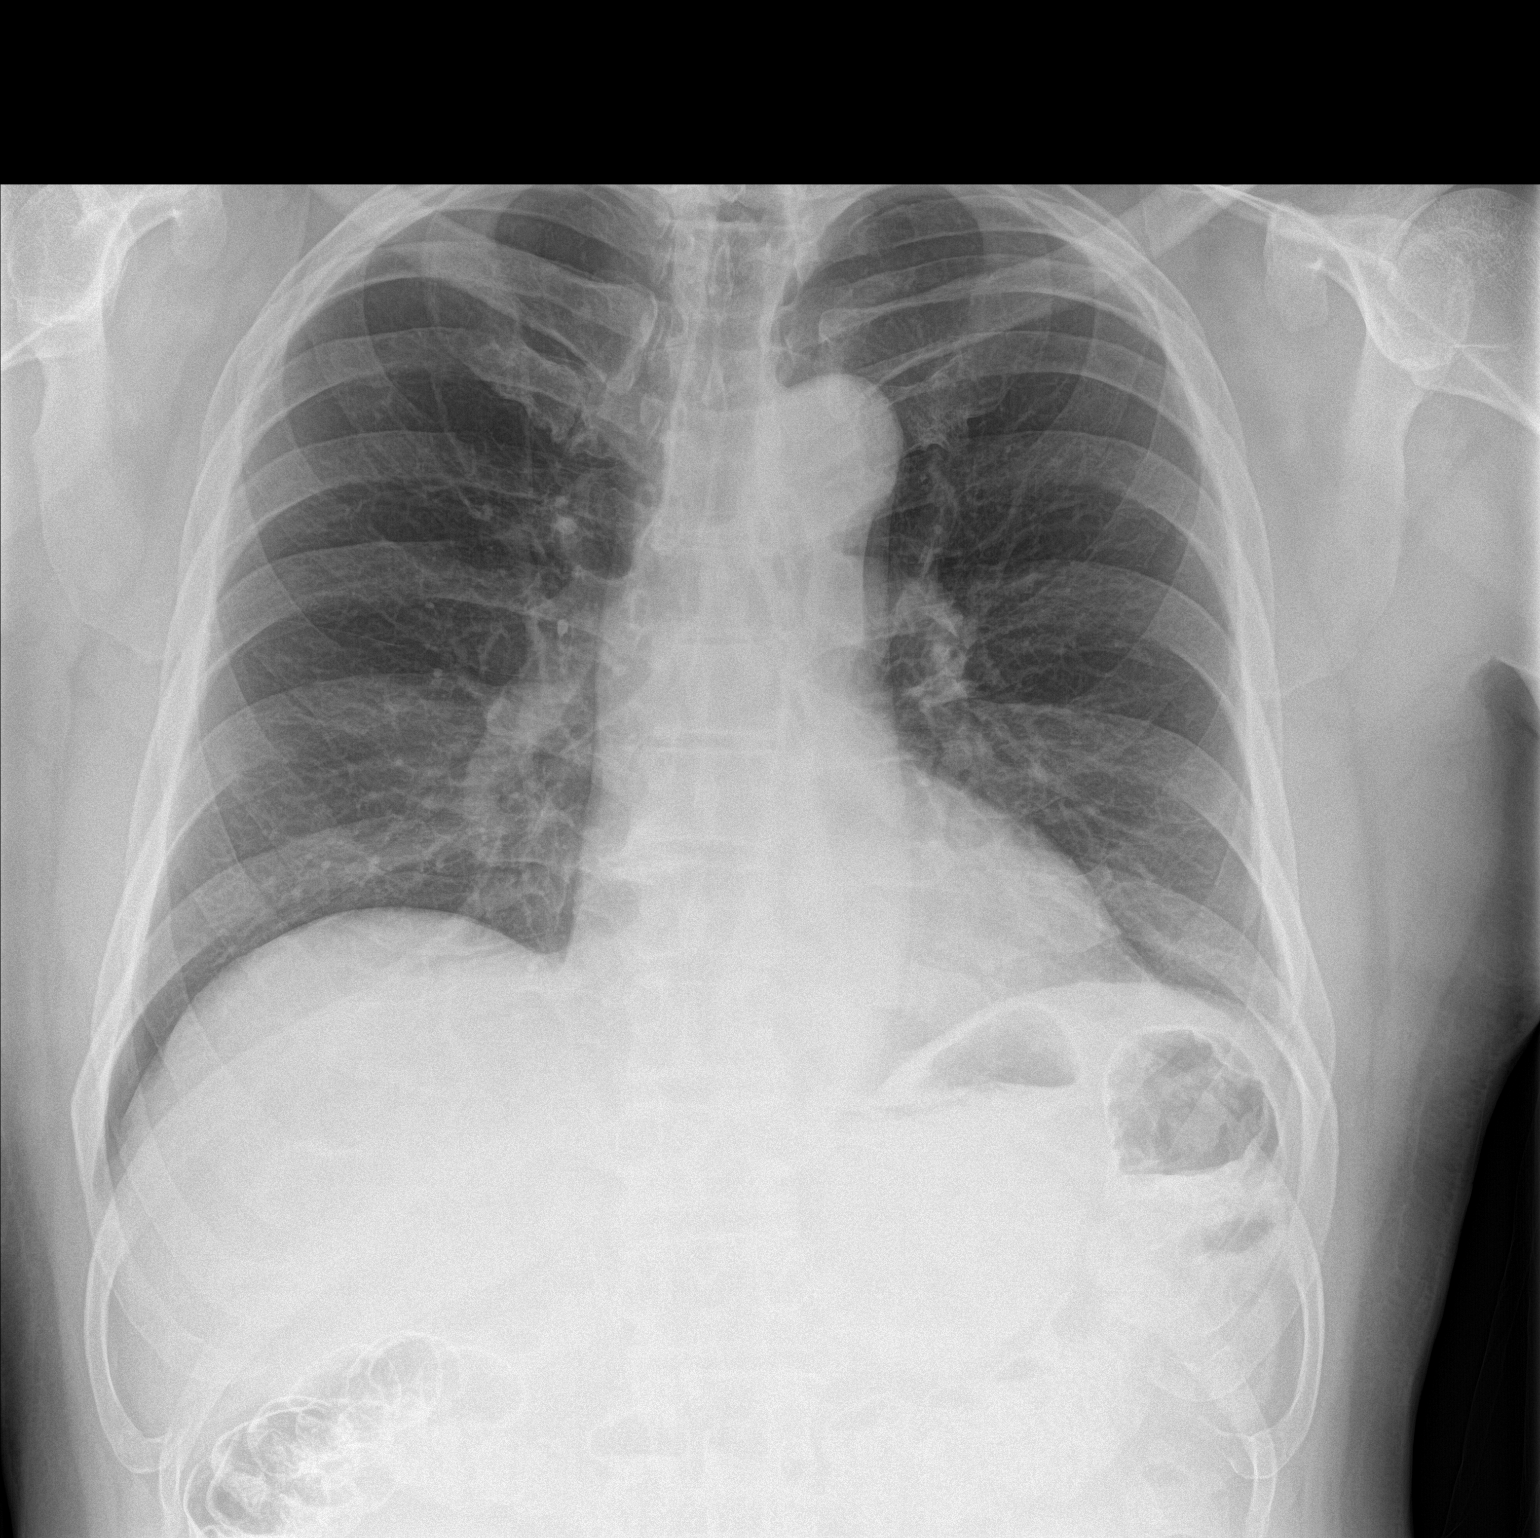
[im 2/2]
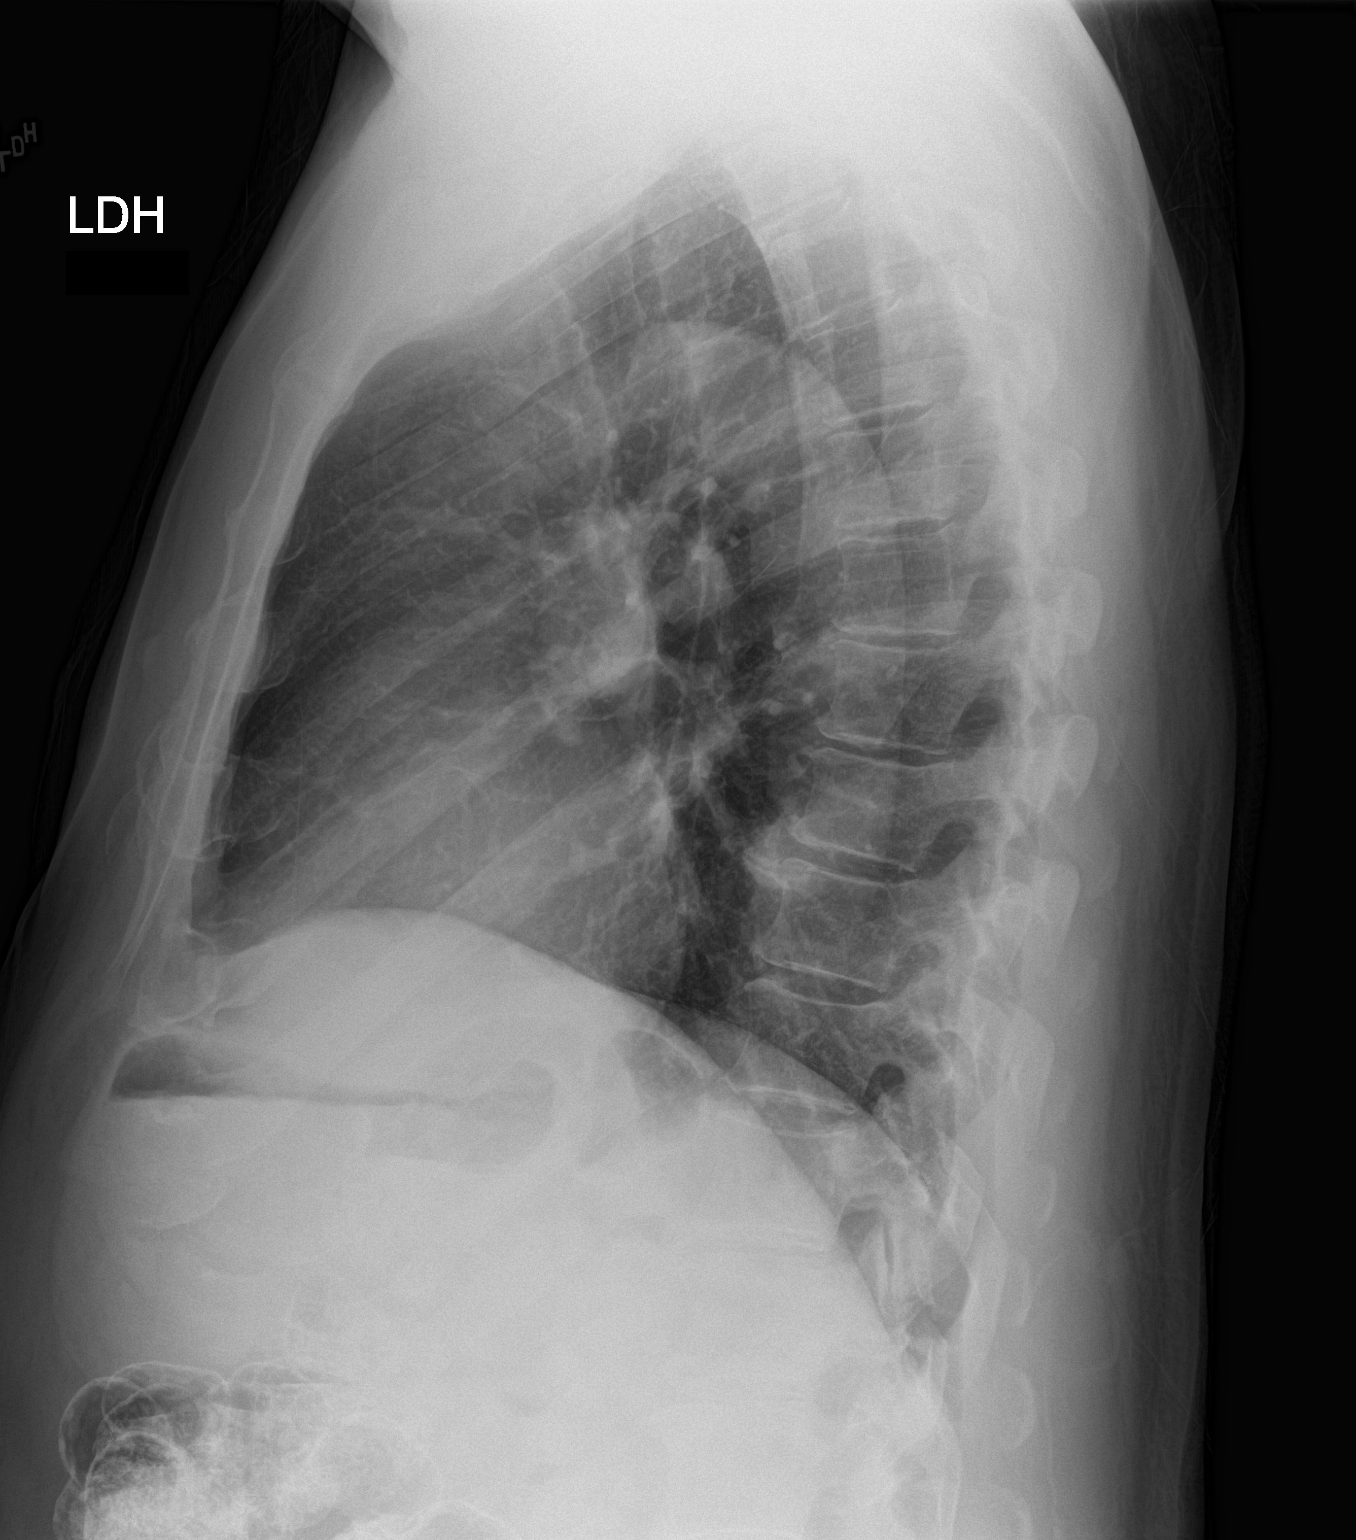

[2 of 2 positions shown; findings below may reference images not displayed]

FINDINGS: The lungs are adequately inflated. There is no focal infiltrate.
There is no pleural effusion. The heart and pulmonary vascularity
are normal. The mediastinum is normal in width. The observed bony
thorax is unremarkable.
IMPRESSION: There is no active cardiopulmonary disease.

## 2018-04-02 ENCOUNTER — Ambulatory Visit (INDEPENDENT_AMBULATORY_CARE_PROVIDER_SITE_OTHER): Payer: Medicare Other | Admitting: Vascular Surgery

## 2018-04-02 ENCOUNTER — Encounter (INDEPENDENT_AMBULATORY_CARE_PROVIDER_SITE_OTHER): Payer: Self-pay | Admitting: Vascular Surgery

## 2018-04-02 ENCOUNTER — Ambulatory Visit (INDEPENDENT_AMBULATORY_CARE_PROVIDER_SITE_OTHER): Payer: Medicare Other

## 2018-04-02 ENCOUNTER — Encounter (INDEPENDENT_AMBULATORY_CARE_PROVIDER_SITE_OTHER): Payer: Medicare Other

## 2018-04-02 ENCOUNTER — Other Ambulatory Visit (INDEPENDENT_AMBULATORY_CARE_PROVIDER_SITE_OTHER): Payer: Medicare Other

## 2018-04-02 VITALS — BP 141/90 | HR 64 | Resp 16 | Ht 76.0 in | Wt 217.0 lb

## 2018-04-02 DIAGNOSIS — I714 Abdominal aortic aneurysm, without rupture, unspecified: Secondary | ICD-10-CM

## 2018-04-02 DIAGNOSIS — E7800 Pure hypercholesterolemia, unspecified: Secondary | ICD-10-CM

## 2018-04-02 DIAGNOSIS — I10 Essential (primary) hypertension: Secondary | ICD-10-CM | POA: Diagnosis not present

## 2018-04-02 NOTE — Assessment & Plan Note (Signed)
lipid control important in reducing the progression of atherosclerotic disease. Continue statin therapy  

## 2018-04-02 NOTE — Patient Instructions (Signed)
Abdominal Aortic Aneurysm Blood pumps away from the heart through tubes (blood vessels) called arteries. Aneurysms are weak or damaged places in the wall of an artery. It bulges out like a balloon. An abdominal aortic aneurysm happens in the main artery of the body (aorta). It can burst or tear, causing bleeding inside the body. This is an emergency. It needs treatment right away. What are the causes? The exact cause is unknown. Things that could cause this problem include:  Fat and other substances building up in the lining of a tube.  Swelling of the walls of a blood vessel.  Certain tissue diseases.  Belly (abdominal) trauma.  An infection in the main artery of the body.  What increases the risk? There are things that make it more likely for you to have an aneurysm. These include:  Being over the age of 68 years old.  Having high blood pressure (hypertension).  Being a male.  Being white.  Being very overweight (obese).  Having a family history of aneurysm.  Using tobacco products.  What are the signs or symptoms? Symptoms depend on the size of the aneurysm and how fast it grows. There may not be symptoms. If symptoms occur, they can include:  Pain (belly, side, lower back, or groin).  Feeling full after eating a small amount of food.  Feeling sick to your stomach (nauseous), throwing up (vomiting), or both.  Feeling a lump in your belly that feels like it is beating (pulsating).  Feeling like you will pass out (faint).  How is this treated?  Medicine to control blood pressure and pain.  Imaging tests to see if the aneurysm gets bigger.  Surgery. How is this prevented? To lessen your chance of getting this condition:  Stop smoking. Stop chewing tobacco.  Limit or avoid alcohol.  Keep your blood pressure, blood sugar, and cholesterol within normal limits.  Eat less salt.  Eat foods low in saturated fats and cholesterol. These are found in animal and  whole dairy products.  Eat more fiber. Fiber is found in whole grains, vegetables, and fruits.  Keep a healthy weight.  Stay active and exercise often.  This information is not intended to replace advice given to you by your health care provider. Make sure you discuss any questions you have with your health care provider. Document Released: 10/07/2012 Document Revised: 11/18/2015 Document Reviewed: 07/12/2012 Elsevier Interactive Patient Education  2017 Elsevier Inc.  

## 2018-04-02 NOTE — Assessment & Plan Note (Signed)
Noninvasive studies today showed normal triphasic waveforms and normal ABIs bilaterally.  His EVAR duplex shows a significant decrease in the size of the as of now measuring approximately 6.2 x 6.6 cm in diameter.  Even after repair, it was still 7.9 cm a year ago. We will continue to monitor this on an annual basis.  He will continue aspirin and statin therapy.  He will contact my office with any problems in the interim.

## 2018-04-02 NOTE — Progress Notes (Signed)
MRN : [id number]  Joe Pena is a 67 y.o. (05-23-1951) male who presents with chief complaint of  Chief Complaint  Patient presents with  . Follow-up    1ur evar and abi results  .  History of Present Illness: Patient returns today in follow up of his abdominal aortic aneurysm.  He is status post endovascular repair of a very large, far greater than 8 cm abdominal aortic aneurysm going on 2 years ago now.  He is doing great today.  He denies any current problems.  No aneurysm related symptoms. Specifically, the patient denies new back or abdominal pain, or signs of peripheral embolization.  Noninvasive studies today showed normal triphasic waveforms and normal ABIs bilaterally.  His EVAR duplex shows a significant decrease in the size of the as of now measuring approximately 6.2 x 6.6 cm in diameter.  Even after repair, it was still 7.9 cm a year ago.  Current Outpatient Medications  Medication Sig Dispense Refill  . albuterol (PROVENTIL HFA;VENTOLIN HFA) 108 (90 Base) MCG/ACT inhaler Inhale into the lungs.    [name] amLODipine-valsartan (EXFORGE) 5-320 MG tablet Take 1 tablet by mouth daily.    [name] atorvastatin (LIPITOR) 40 MG tablet Take 1 tablet by mouth daily.    . cetirizine (ZYRTEC) 10 MG tablet Take by mouth.    . fluticasone (FLONASE) 50 MCG/ACT nasal spray Place 1-2 sprays into the nose as needed for allergies.    . Multiple Vitamins-Minerals (CENTRUM SILVER PO) Take 1 tablet by mouth daily.    [name] omeprazole (PRILOSEC) 20 MG capsule Take by mouth.    . rosuvastatin (CRESTOR) 10 MG tablet Take 10 mg by mouth daily.    . valsartan-hydrochlorothiazide (DIOVAN-HCT) 320-25 MG tablet Take 1 tablet by mouth daily.    [name] amLODipine (NORVASC) 10 MG tablet Take 10 mg by mouth daily.    [name] aspirin 81 MG tablet Take 81 mg by mouth daily.    . Folic Acid-Vit O7-SJG G83 0.8-10-0.115 MG TABS Take by mouth.    . losartan (COZAAR) 100 MG tablet Take 100 mg by mouth daily.     No current  facility-administered medications for this visit.     Past Medical History:  Diagnosis Date  . AAA (abdominal aortic aneurysm) ([place])   . Colon polyps   . Hyperlipidemia   . Hypertension   . 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 ACHILLES TENDON SURGERY    . COLONOSCOPY    . COLONOSCOPY WITH PROPOFOL N/A 08/13/2017   Procedure: COLONOSCOPY WITH PROPOFOL;  Surgeon: Lollie Sails, MD;  Location[facility] ENDOSCOPY;  Service: Endoscopy;  Laterality: N/A;  . HERNIA REPAIR    . KNEE SURGERY Right   . lens repair    . PERIPHERAL VASCULAR CATHETERIZATION N/A 06/07/2016   Procedure: Endovascular Repair/Stent Graft;  Surgeon: [name], MD;  Location: [facility] CV LAB;  Service: Cardiovascular;  Laterality: N/A;    Social History   Substance Use Topics   . Smoking status: Never Smoker   . Smokeless tobacco: Never Used   . Alcohol use Yes     Comment: 2 times weekly    Family History      Family History  Problem Relation Age of Onset  . Heart disease Mother   . Aortic aneurysm Mother   . Hyperlipidemia Father   . Prostate cancer Neg Hx   . Bladder Cancer Neg Hx   . Kidney  cancer Neg Hx        Allergies  Allergen Reactions  . Pollen Extract Shortness Of Breath and Itching   REVIEW OF SYSTEMS (Negative unless checked)  Constitutional: [] Weight loss  [] Fever  [] Chills Cardiac: [] Chest pain   [] Chest pressure   [] Palpitations   [] Shortness of breath when laying flat   [] Shortness of breath at rest   [] Shortness of breath with exertion. Vascular:  [] Pain in legs with walking   [] Pain in legs at rest   [] Pain in legs when laying flat   [] Claudication   [] Pain in feet when walking  [] Pain in feet at rest  [] Pain in feet when laying flat   [] History of DVT   [] Phlebitis   [] Swelling in legs   [] Varicose veins   [] Non-healing ulcers Pulmonary:   [] Uses home oxygen   [] Productive cough   [] Hemoptysis   [] Wheeze   [] COPD   [] Asthma Neurologic:  [] Dizziness  [] Blackouts   [] Seizures   [] History of stroke   [] History of TIA  [] Aphasia   [] Temporary blindness   [] Dysphagia   [] Weakness or numbness in arms   [] Weakness or numbness in legs Musculoskeletal:  [] Arthritis   [] Joint swelling   [] Joint pain   [] Low back pain Hematologic:  [] Easy bruising  [] Easy bleeding   [] Hypercoagulable state   [] Anemic   Gastrointestinal:  [] Blood in stool   [] Vomiting blood  [] Gastroesophageal reflux/heartburn   [] Abdominal pain Genitourinary:  [] Chronic kidney disease   [] Difficult urination  [] Frequent urination  [] Burning with urination   [] Hematuria Skin:  [] Rashes   [] Ulcers   [] Wounds Psychological:  [] History of anxiety   []  History of major depression.    Physical Examination  BP (!) 141/90 (BP Location: Right Arm)   Pulse 64   Resp 16   Ht 6\' 4"  (1.93 m)   Wt 217 lb (98.4 kg)   BMI 26.41 kg/m  Gen:  WD/WN, NAD Head: [place]/AT, No temporalis wasting. Ear/Nose/Throat: Hearing grossly intact, nares w/o erythema or drainage Eyes: Conjunctiva clear. Sclera non-icteric Neck: Supple.  Trachea midline Pulmonary:  Good air movement, no use of accessory muscles.  Cardiac: RRR, no JVD Vascular:  Vessel Right Left  Radial Palpable Palpable                          PT Palpable Palpable  DP Palpable Palpable   Gastrointestinal: soft, non-tender/non-distended. No increased aortic impulse Musculoskeletal: M/S 5/5 throughout.  No deformity or atrophy. No edema. Neurologic: Sensation grossly intact in extremities.  Symmetrical.  Speech is fluent.  Psychiatric: Judgment intact, Mood & affect appropriate for pt's clinical situation. Dermatologic: No rashes or ulcers noted.  No cellulitis or open wounds.       Labs No results found for this or any previous visit (from the past 2160 hour(s)).  Radiology No results found.  Assessment/Plan  Essential hypertension blood pressure control important in  reducing the progression of atherosclerotic disease and aneurysmal degeneration. On appropriate oral medications.   Pure hypercholesterolemia lipid control important in reducing the progression of atherosclerotic disease. Continue statin therapy   AAA (abdominal aortic aneurysm) without rupture (HCC) Noninvasive studies today showed normal triphasic waveforms and normal ABIs bilaterally.  His EVAR duplex shows a significant decrease in the size of the as of now measuring approximately 6.2 x 6.6 cm in diameter.  Even after repair, it was still 7.9 cm a year ago. We will continue to monitor this on an  annual basis.  He will continue aspirin and statin therapy.  He will contact my office with any problems in the interim.    Leotis Pain, MD  04/02/2018 11:18 AM    This note was created with Dragon medical transcription system.  Any errors from dictation are purely unintentional

## 2018-04-02 NOTE — Assessment & Plan Note (Signed)
blood pressure control important in reducing the progression of atherosclerotic disease and aneurysmal degeneration. On appropriate oral medications.  

## 2018-05-29 DIAGNOSIS — I739 Peripheral vascular disease, unspecified: Secondary | ICD-10-CM | POA: Insufficient documentation

## 2018-07-25 ENCOUNTER — Encounter: Payer: Self-pay | Admitting: Internal Medicine

## 2018-07-27 DEATH — deceased

## 2018-08-16 ENCOUNTER — Ambulatory Visit: Payer: Medicare Other | Admitting: Internal Medicine

## 2018-10-04 ENCOUNTER — Ambulatory Visit: Payer: Medicare Other | Admitting: Internal Medicine

## 2019-02-10 DIAGNOSIS — G4733 Obstructive sleep apnea (adult) (pediatric): Secondary | ICD-10-CM | POA: Insufficient documentation

## 2019-04-04 ENCOUNTER — Ambulatory Visit (INDEPENDENT_AMBULATORY_CARE_PROVIDER_SITE_OTHER): Payer: Medicare Other

## 2019-04-04 ENCOUNTER — Encounter (INDEPENDENT_AMBULATORY_CARE_PROVIDER_SITE_OTHER): Payer: Self-pay | Admitting: Vascular Surgery

## 2019-04-04 ENCOUNTER — Other Ambulatory Visit: Payer: Self-pay

## 2019-04-04 ENCOUNTER — Ambulatory Visit (INDEPENDENT_AMBULATORY_CARE_PROVIDER_SITE_OTHER): Payer: Medicare Other | Admitting: Vascular Surgery

## 2019-04-04 VITALS — BP 133/84 | HR 63 | Resp 16 | Ht 76.0 in | Wt 226.0 lb

## 2019-04-04 DIAGNOSIS — E7800 Pure hypercholesterolemia, unspecified: Secondary | ICD-10-CM | POA: Diagnosis not present

## 2019-04-04 DIAGNOSIS — I714 Abdominal aortic aneurysm, without rupture, unspecified: Secondary | ICD-10-CM

## 2019-04-04 DIAGNOSIS — I10 Essential (primary) hypertension: Secondary | ICD-10-CM

## 2019-04-04 NOTE — Patient Instructions (Signed)
Abdominal Aortic Aneurysm  An aneurysm is a bulge in an artery. It happens when blood pushes against a weakened or damaged artery wall. An abdominal aortic aneurysm (AAA) is an aneurysm that occurs in the lower part of the aorta, which is the main artery of the body. The aorta supplies blood from the heart to the rest of the body. Some aneurysms may not cause symptoms. However, an AAA can cause two serious problems:  It can enlarge and burst (rupture).  It can cause blood to flow between the layers of the wall of the aorta through a tear (aortic dissection). Both of these problems are medical emergencies. They can cause bleeding inside the body. If they are not diagnosed and treated right away, they can be life-threatening. What are the causes? The exact cause of this condition is not known. What increases the risk? The following factors may make you more likely to develop this condition:  Being a male 60 years of age or older.  Being Caucasian.  Using tobacco or having a history of tobacco use.  Having a family history of aneurysms.  Having any of the following conditions: ? Hardening of the arteries (arteriosclerosis). ? Inflammation of the walls of an artery (arteritis). ? Certain genetic conditions. ? Obesity. ? An infection in the wall of the aorta (infectious aortitis) caused by bacteria. ? High cholesterol. ? High blood pressure (hypertension). What are the signs or symptoms? Symptoms of this condition vary depending on the size of the aneurysm and how fast it is growing. Most aneurysms grow slowly and do not cause any symptoms. When symptoms do occur, they may include:  Pain in the abdomen, side, or lower back.  Feeling full after eating only small amounts of food.  Feeling a pulsating lump in the abdomen. Symptoms that the aneurysm has ruptured include:  A sudden onset of severe pain in the abdomen, side, or back.  Nausea or vomiting.  Feeling light-headed or  passing out. How is this diagnosed? This condition may be diagnosed with:  A physical exam to check for throbbing and to listen to blood flow in your abdomen.  Tests, such as: ? Ultrasound. ? X-rays. ? CT scan. ? MRI. ? Tests to check your arteries for damage or blockage (angiogram). Because most unruptured AAAs cause no symptoms, they are often found during exams for other conditions. How is this treated? Treatment for this condition depends on:  The size of the aneurysm.  How fast the aneurysm is growing.  Your age.  Risk factors for rupture. If your aneurysm is smaller than 2 inches (5 cm), your health care provider may manage it by:  Checking (monitoring) it regularly to see if it is getting bigger. Depending on the size of the aneurysm, how fast it is growing, and your other risk factors, you may have an ultrasound to monitor it every 3-6 months, every year, or every few years.  Giving you medicines to control blood pressure, treat pain, or fight infection. If your aneurysm is larger than 2 inches (5 cm), your health care provider may do surgery to repair it. Follow these instructions at home: Lifestyle  Do not use any products that contain nicotine or tobacco, such as cigarettes, e-cigarettes, and chewing tobacco. If you need help quitting, ask your health care provider.  Stay physically active and exercise regularly. Talk with your health care provider about how often you should exercise and which types of exercise are safe for you. Eating and drinking    Eat a heart-healthy diet. This includes plenty of fresh fruits and vegetables, whole grains, low-fat (lean) protein, and low-fat dairy products.  Avoid foods that are high in saturated fat and cholesterol, such as red meat and some dairy products.  Do not drink alcohol if: ? Your health care provider tells you not to drink. ? You are pregnant, may be pregnant, or are planning to become pregnant.  If you drink  alcohol: ? Limit how much you use to:  0-1 drink a day for women.  0-2 drinks a day for men. ? Be aware of how much alcohol is in your drink. In the [place] one drink equals one typical bottle of beer (12 oz), one-half glass of wine (5 oz), or one shot of hard liquor (1 oz). General instructions  Take over-the-counter and prescription medicines only as told by your health care provider.  Keep your blood pressure within a normal range. Check it regularly, and ask your health care provider what your target blood pressure should be.  Have your blood sugar (glucose) level and cholesterol levels checked regularly. Follow your health care provider's instructions on how to keep levels within normal limits.  Avoid heavy lifting and activities that take a lot of effort (are strenuous). Ask your health care provider what activities are safe for you.  Keep all follow-up visits as told by your health care provider. This is important. Contact a health care provider if you:  Have pain in your abdomen, side, or back.  Have a throbbing feeling in your abdomen. Get help right away if you:  Have sudden, severe pain in your abdomen, side, or back.  Experience nausea or vomiting.  Have constipation or problems urinating.  Feel light-headed.  Have a rapid heart rate when you stand.  Have sweaty, clammy skin.  Have shortness of breath.  Have a fever. These symptoms may represent a serious problem that is an emergency. Do not wait to see if the symptoms will go away. Get medical help right away. Call your local emergency services (911 in the [place] Do not drive yourself to the hospital. Summary  An aneurysm is a bulge in an artery. It happens when blood pushes against a weakened or damaged artery wall.  Being older, male, Caucasian, having a history of tobacco use, and a family history of aneurysms can increase the risk.  These problems can cause bleeding inside the body and can be  life-threatening. Get medical help right away. This information is not intended to replace advice given to you by your health care provider. Make sure you discuss any questions you have with your health care provider. Document Released: 03/22/2005 Document Revised: 09/30/2018 Document Reviewed: 01/19/2018 Elsevier Patient Education  2020 [employer]  

## 2019-04-04 NOTE — Assessment & Plan Note (Signed)
His duplex today shows no obvious endoleak although the aortic sac size has grown about 8 mm since his last visit and now shows a size of 7.4 cm in maximal diameter.  It was 7.9 cm 2 years ago With the sac growth since his last visit, I would like to shorten his follow-up interval to 6 months.  This is still significantly smaller than the starting size and smaller than it was 2 years ago on duplex even after repair.  It is possible that last year study was spurious [place] low, but if continued growth is seen on the neck study I would recommend a CT scan for further evaluation.

## 2019-04-04 NOTE — Progress Notes (Signed)
MRN : [id number]  Joe Pena is a 68 y.o. ([birth year]) male who presents with chief complaint of  Chief Complaint  Patient presents with  . Follow-up    ultrasound follow up  .  History of Present Illness: Patient returns today in follow up of his abdominal aortic aneurysm.  He is about 3 years status post aneurysm repair for a very large, greater than 8 cm aneurysm.  He is doing well today without any major concerns or complaints.  No aneurysm related symptoms.  His duplex today shows no obvious endoleak although the aortic sac size has grown about 8 mm since his last visit and now shows a size of 7.4 cm in maximal diameter.  It was 7.9 cm 2 years ago.  Current Outpatient Medications  Medication Sig Dispense Refill  . albuterol (PROVENTIL HFA;VENTOLIN HFA) 108 (90 Base) MCG/ACT inhaler Inhale into the lungs.    [name] amLODipine (NORVASC) 10 MG tablet Take 10 mg by mouth daily.    [name] atorvastatin (LIPITOR) 40 MG tablet Take 1 tablet by mouth daily.    [name] losartan (COZAAR) 100 MG tablet Take 100 mg by mouth daily.    [name] amLODipine-valsartan (EXFORGE) 5-320 MG tablet Take 1 tablet by mouth daily.    [name] aspirin 81 MG tablet Take 81 mg by mouth daily.    . cetirizine (ZYRTEC) 10 MG tablet Take by mouth.    . fluticasone (FLONASE) 50 MCG/ACT nasal spray Place 1-2 sprays into the nose as needed for allergies.    . Folic Acid-Vit Q000111Q [id number] 0.8-10-0.115 MG TABS Take by mouth.    . Multiple Vitamins-Minerals (CENTRUM SILVER PO) Take 1 tablet by mouth daily.    [name] omeprazole (PRILOSEC) 20 MG capsule Take by mouth.    . rosuvastatin (CRESTOR) 10 MG tablet Take 10 mg by mouth daily.    . valsartan-hydrochlorothiazide (DIOVAN-HCT) 320-25 MG tablet Take 1 tablet by mouth daily.     No current facility-administered medications for this visit.     Past Medical History:  Diagnosis Date  . AAA (abdominal aortic aneurysm) ([place])   . Colon polyps   . Hyperlipidemia   . Hypertension   . 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 ACHILLES TENDON SURGERY    . COLONOSCOPY    . COLONOSCOPY WITH PROPOFOL N/A 08/13/2017   Procedure: COLONOSCOPY WITH PROPOFOL;  Surgeon: Lollie Sails, MD;  Location[facility] ENDOSCOPY;  Service: Endoscopy;  Laterality: N/A;  . HERNIA REPAIR    . KNEE SURGERY Right   . lens repair    . PERIPHERAL VASCULAR CATHETERIZATION N/A 06/07/2016   Procedure: Endovascular Repair/Stent Graft;  Surgeon: [name], MD;  Location: [facility];  Service: Cardiovascular;  Laterality: N/A;    Social History Social History   Tobacco Use  . Smoking status: Never Smoker  . Smokeless tobacco: Never Used  Substance Use Topics  . Alcohol use: Yes    Comment: 2 times weekly  . Drug use: No     Family History Family History  Problem Relation Age of Onset  . Heart disease Mother   . Aortic aneurysm Mother   . Hyperlipidemia Father   . Prostate cancer Neg Hx   . Bladder Cancer Neg Hx   . Kidney cancer Neg Hx      Allergies  Allergen Reactions  . Pollen Extract Shortness Of Breath and Itching     REVIEW OF  SYSTEMS (Negative unless checked)  Constitutional: [] Weight loss  [] Fever  [] Chills Cardiac: [] Chest pain   [] Chest pressure   [] Palpitations   [] Shortness of breath when laying flat   [] Shortness of breath at rest   [] Shortness of breath with exertion. Vascular:  [] Pain in legs with walking   [] Pain in legs at rest   [] Pain in legs when laying flat   [] Claudication   [] Pain in feet when walking  [] Pain in feet at rest  [] Pain in feet when laying flat   [] History of DVT   [] Phlebitis   [] Swelling in legs   [] Varicose veins   [] Non-healing ulcers Pulmonary:   [] Uses home oxygen   [] Productive cough   [] Hemoptysis   [] Wheeze  [] COPD   [] Asthma Neurologic:  [] Dizziness  [] Blackouts   [] Seizures   [] History of stroke   [] History of TIA  [] Aphasia   [] Temporary blindness   [] Dysphagia   [] Weakness or  numbness in arms   [] Weakness or numbness in legs Musculoskeletal:  [x] Arthritis   [] Joint swelling   [] Joint pain   [] Low back pain Hematologic:  [] Easy bruising  [] Easy bleeding   [] Hypercoagulable state   [] Anemic   Gastrointestinal:  [] Blood in stool   [] Vomiting blood  [] Gastroesophageal reflux/heartburn   [] Abdominal pain Genitourinary:  [] Chronic kidney disease   [] Difficult urination  [] Frequent urination  [] Burning with urination   [] Hematuria Skin:  [] Rashes   [] Ulcers   [] Wounds Psychological:  [] History of anxiety   []  History of major depression.  Physical Examination  BP 133/84 (BP Location: Right Arm)   Pulse 63   Resp 16   Ht 6\' 4"  (1.93 m)   Wt 226 lb (102.5 kg)   BMI 27.51 kg/m  Gen:  WD/WN, NAD Head: [place]/AT, No temporalis wasting. Ear/Nose/Throat: Hearing grossly intact, nares w/o erythema or drainage Eyes: Conjunctiva clear. Sclera non-icteric Neck: Supple.  Trachea midline Pulmonary:  Good air movement, no use of accessory muscles.  Cardiac: RRR, no JVD Vascular:  Vessel Right Left  Radial Palpable Palpable                                   Gastrointestinal: soft, non-tender/non-distended. No guarding/reflex.  Musculoskeletal: M/S 5/5 throughout.  No deformity or atrophy.  No lower extremity edema. Neurologic: Sensation grossly intact in extremities.  Symmetrical.  Speech is fluent.  Psychiatric: Judgment intact, Mood & affect appropriate for pt's clinical situation. Dermatologic: No rashes or ulcers noted.  No cellulitis or open wounds.       Labs No results found for this or any previous visit (from the past 2160 hour(s)).  Radiology No results found.  Assessment/Plan Essential hypertension blood pressure control important in reducing the progression of atherosclerotic disease and aneurysmal degeneration. On appropriate oral medications.   Pure hypercholesterolemia lipid control important in reducing the progression of atherosclerotic  disease. Continue statin therapy  AAA (abdominal aortic aneurysm) without rupture ([facility]) His duplex today shows no obvious endoleak although the aortic sac size has grown about 8 mm since his last visit and now shows a size of 7.4 cm in maximal diameter.  It was 7.9 cm 2 years ago With the sac growth since his last visit, I would like to shorten his follow-up interval to 6 months.  This is still significantly smaller than the starting size and smaller than it was 2 years ago on duplex even after repair.  It is  possible that last year study was spurious [place] low, but if continued growth is seen on the neck study I would recommend a CT scan for further evaluation.    Leotis Pain, MD  04/04/2019 9:55 AM    This note was created with Dragon medical transcription system.  Any errors from dictation are purely unintentional

## 2019-07-02 ENCOUNTER — Telehealth (INDEPENDENT_AMBULATORY_CARE_PROVIDER_SITE_OTHER): Payer: Self-pay

## 2019-07-03 NOTE — Telephone Encounter (Signed)
Patient has been made aware with medical advice and verbalized understanding. The patient stated that he will speak with his wife and call if he would like to move forward.

## 2019-10-03 ENCOUNTER — Ambulatory Visit (INDEPENDENT_AMBULATORY_CARE_PROVIDER_SITE_OTHER): Payer: Medicare Other | Admitting: Vascular Surgery

## 2019-10-03 ENCOUNTER — Other Ambulatory Visit (INDEPENDENT_AMBULATORY_CARE_PROVIDER_SITE_OTHER): Payer: Medicare Other

## 2019-10-10 ENCOUNTER — Ambulatory Visit (INDEPENDENT_AMBULATORY_CARE_PROVIDER_SITE_OTHER): Payer: Medicare Other

## 2019-10-10 ENCOUNTER — Encounter (INDEPENDENT_AMBULATORY_CARE_PROVIDER_SITE_OTHER): Payer: Self-pay | Admitting: Vascular Surgery

## 2019-10-10 ENCOUNTER — Ambulatory Visit (INDEPENDENT_AMBULATORY_CARE_PROVIDER_SITE_OTHER): Payer: Medicare Other | Admitting: Vascular Surgery

## 2019-10-10 ENCOUNTER — Other Ambulatory Visit: Payer: Self-pay

## 2019-10-10 VITALS — BP 133/85 | HR 63 | Resp 16 | Ht 76.0 in | Wt 221.0 lb

## 2019-10-10 DIAGNOSIS — E7800 Pure hypercholesterolemia, unspecified: Secondary | ICD-10-CM | POA: Diagnosis not present

## 2019-10-10 DIAGNOSIS — I10 Essential (primary) hypertension: Secondary | ICD-10-CM

## 2019-10-10 DIAGNOSIS — I714 Abdominal aortic aneurysm, without rupture, unspecified: Secondary | ICD-10-CM

## 2019-10-10 NOTE — Assessment & Plan Note (Signed)
His duplex today shows a marked decrease in the size of his aneurysm sac now down to 6.4 x 6.8 cm in maximal diameter.  This was 7.4 cm at last check.  There is no endoleak seen on duplex.  Overall he is doing well.  We can go back to an annual follow-up at this point he will contact our office with any problems in the interim.

## 2019-10-10 NOTE — Patient Instructions (Signed)
Abdominal Aortic Aneurysm  An aneurysm is a bulge in one of the blood vessels that carry blood away from the heart (artery). It happens when blood pushes up against a weak or damaged place in the wall of an artery. An abdominal aortic aneurysm happens in the main artery of the body (aorta). Some aneurysms may not cause problems. If it grows, it can burst or tear, causing bleeding inside the body. This is an emergency. It needs to be treated right away. What are the causes? The exact cause of this condition is not known. What increases the risk? The following may make you more likely to get this condition:  Being a male who is 60 years of age or older.  Being white (Caucasian).  Using tobacco.  Having a family history of aneurysms.  Having the following conditions: ? Hardening of the arteries (arteriosclerosis). ? Inflammation of the walls of an artery (arteritis). ? Certain genetic conditions. ? Being very overweight (obesity). ? An infection in the wall of the aorta (infectious aortitis). ? High cholesterol. ? High blood pressure (hypertension). What are the signs or symptoms? Symptoms depend on the size of the aneurysm and how fast it is growing. Most grow slowly and do not cause any symptoms. If symptoms do occur, they may include:  Pain in the belly (abdomen), side, or back.  Feeling full after eating only small amounts of food.  Feeling a throbbing lump in the belly. Symptoms that the aneurysm has burst (ruptured) include:  Sudden, very bad pain in the belly, side, or back.  Feeling sick to your stomach (nauseous).  Throwing up (vomiting).  Feeling light-headed or passing out. How is this treated? Treatment for this condition depends on:  The size of the aneurysm.  How fast it is growing.  Your age.  Your risk of having it burst. If your aneurysm is smaller than 2 inches (5 cm), your doctor may manage it by:  Checking it often to see if it is getting bigger.  You may have an imaging test (ultrasound) to check it every 3-6 months, every year, or every few years.  Giving you medicines to: ? Control blood pressure. ? Treat pain. ? Fight infection. If your aneurysm is larger than 2 inches (5 cm), you may need surgery to fix it. Follow these instructions at home: Lifestyle  Do not use any products that have nicotine or tobacco in them. This includes cigarettes, e-cigarettes, and chewing tobacco. If you need help quitting, ask your doctor.  Get regular exercise. Ask your doctor what types of exercise are best for you. Eating and drinking  Eat a heart-healthy diet. This includes eating plenty of: ? Fresh fruits and vegetables. ? Whole grains. ? Low-fat (lean) protein. ? Low-fat dairy products.  Avoid foods that are high in saturated fat and cholesterol. These foods include red meat and some dairy products.  Do not drink alcohol if: ? Your doctor tells you not to drink. ? You are pregnant, may be pregnant, or are planning to become pregnant.  If you drink alcohol: ? Limit how much you use to:  0-1 drink a day for women.  0-2 drinks a day for men. ? Be aware of how much alcohol is in your drink. In the [place] one drink equals any of these:  One typical bottle of beer (12 oz).  One-half glass of wine (5 oz).  One shot of hard liquor (1 oz). General instructions  Take over-the-counter and prescription medicines only as   told by your doctor.  Keep your blood pressure within normal limits. Ask your doctor what your blood pressure should be.  Have your blood sugar (glucose) level and cholesterol levels checked regularly. Keep your blood sugar level and cholesterol levels within normal limits.  Avoid heavy lifting and activities that take a lot of effort. Ask your doctor what activities are safe for you.  Keep all follow-up visits as told by your doctor. This is important. ? Talk to your doctor about regular screenings to see if the  aneurysm is getting bigger. Contact a doctor if you:  Have pain in your belly, side, or back.  Have a throbbing feeling in your belly.  Have a family history of aneurysms. Get help right away if you:  Have sudden, bad pain in your belly, side, or back.  Feel sick to your stomach.  Throw up.  Have trouble pooping (constipation).  Have trouble peeing (urinating).  Feel light-headed.  Have a fast heart rate when you stand.  Have sweaty skin that is cold to the touch (clammy).  Have shortness of breath.  Have a fever. These symptoms may be an emergency. Do not wait to see if the symptoms will go away. Get medical help right away. Call your local emergency services (911 in the [place] Do not drive yourself to the hospital. Summary  An aneurysm is a bulge in one of the blood vessels that carry blood away from the heart (artery). Some aneurysms may not cause problems.  You may need to have yours checked often. If it grows, it can burst or tear. This causes bleeding inside the body. It needs to be treated right away.  Follow instructions from your doctor about healthy lifestyle changes.  Keep all follow-up visits as told by your doctor. This is important. This information is not intended to replace advice given to you by your health care provider. Make sure you discuss any questions you have with your health care provider. Document Revised: 09/30/2018 Document Reviewed: 01/19/2018 Elsevier Patient Education  2020 [employer]  

## 2019-10-10 NOTE — Progress Notes (Signed)
MRN : [id number]  Joe Pena is a 69 y.o. (1951-03-19) male who presents with chief complaint of No chief complaint on file. [name]  History of Present Illness: Patient returns today in follow up of his abdominal aortic aneurysm.  He had this repaired with a stent graft in 2017.  It was over 8 cm at the time that was repaired.  No current aneurysm related symptoms. Specifically, the patient denies new back or abdominal pain, or signs of peripheral embolization.  He has noticed some umbilical hernia although it is not currently bothering him that much. His duplex today shows a marked decrease in the size of his aneurysm sac now down to 6.4 x 6.8 cm in maximal diameter.  This was 7.4 cm at last check.  There is no endoleak seen on duplex.  Current Outpatient Medications  Medication Sig Dispense Refill  . albuterol (PROVENTIL HFA;VENTOLIN HFA) 108 (90 Base) MCG/ACT inhaler Inhale into the lungs.    [name] amLODipine (NORVASC) 10 MG tablet Take 10 mg by mouth daily.    [name] amLODipine-valsartan (EXFORGE) 5-320 MG tablet Take 1 tablet by mouth daily.    [name] aspirin 81 MG tablet Take 81 mg by mouth daily.    [name] atorvastatin (LIPITOR) 40 MG tablet Take 1 tablet by mouth daily.    . cetirizine (ZYRTEC) 10 MG tablet Take by mouth.    . fluticasone (FLONASE) 50 MCG/ACT nasal spray Place 1-2 sprays into the nose as needed for allergies.    . Folic Acid-Vit Q000111Q 123456 0.8-10-0.115 MG TABS Take by mouth.    . losartan (COZAAR) 100 MG tablet Take 100 mg by mouth daily.    . Multiple Vitamins-Minerals (CENTRUM SILVER PO) Take 1 tablet by mouth daily.    [name] omeprazole (PRILOSEC) 20 MG capsule Take by mouth.    . rosuvastatin (CRESTOR) 10 MG tablet Take 10 mg by mouth daily.    . valsartan-hydrochlorothiazide (DIOVAN-HCT) 320-25 MG tablet Take 1 tablet by mouth daily.     No current facility-administered medications for this visit.    Past Medical History:  Diagnosis Date  . AAA (abdominal aortic aneurysm)  ([place])   . Colon polyps   . Hyperlipidemia   . Hypertension   . 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 ACHILLES TENDON SURGERY    . COLONOSCOPY    . COLONOSCOPY WITH PROPOFOL N/A 08/13/2017   Procedure: COLONOSCOPY WITH PROPOFOL;  Surgeon: Lollie Sails, MD;  Location: [facility] ENDOSCOPY;  Service: Endoscopy;  Laterality: N/A;  . HERNIA REPAIR    . KNEE SURGERY Right   . lens repair    . PERIPHERAL VASCULAR CATHETERIZATION N/A 06/07/2016   Procedure: Endovascular Repair/Stent Graft;  Surgeon: [name], MD;  Location: [place] [facility];  Service: Cardiovascular;  Laterality: N/A;     Social History   Tobacco Use  . Smoking status: Never Smoker  . Smokeless tobacco: Never Used  Substance Use Topics  . Alcohol use: Yes    Comment: 2 times weekly  . Drug use: No       Family History  Problem Relation Age of Onset  . Heart disease Mother   . Aortic aneurysm Mother   . Hyperlipidemia Father   . Prostate cancer Neg Hx   . Bladder Cancer Neg Hx   . Kidney cancer Neg Hx      Allergies  Allergen Reactions  . Pollen Extract Shortness Of Breath  and Itching     REVIEW OF SYSTEMS (Negative unless checked)  Constitutional: [] Weight loss  [] Fever  [] Chills Cardiac: [] Chest pain   [] Chest pressure   [] Palpitations   [] Shortness of breath when laying flat   [] Shortness of breath at rest   [] Shortness of breath with exertion. Vascular:  [] Pain in legs with walking   [] Pain in legs at rest   [] Pain in legs when laying flat   [] Claudication   [] Pain in feet when walking  [] Pain in feet at rest  [] Pain in feet when laying flat   [] History of DVT   [] Phlebitis   [] Swelling in legs   [] Varicose veins   [] Non-healing ulcers Pulmonary:   [] Uses home oxygen   [] Productive cough   [] Hemoptysis   [] Wheeze  [] COPD   [] Asthma Neurologic:  [] Dizziness  [] Blackouts   [] Seizures   [] History of stroke   [] History of TIA  [] Aphasia    [] Temporary blindness   [] Dysphagia   [] Weakness or numbness in arms   [] Weakness or numbness in legs Musculoskeletal:  [x] Arthritis   [] Joint swelling   [x] Joint pain   [] Low back pain Hematologic:  [] Easy bruising  [] Easy bleeding   [] Hypercoagulable state   [] Anemic   Gastrointestinal:  [] Blood in stool   [] Vomiting blood  [] Gastroesophageal reflux/heartburn   [] Abdominal pain Genitourinary:  [] Chronic kidney disease   [] Difficult urination  [] Frequent urination  [] Burning with urination   [] Hematuria Skin:  [] Rashes   [] Ulcers   [] Wounds Psychological:  [] History of anxiety   []  History of major depression.  Physical Examination  There were no vitals taken for this visit. Gen:  WD/WN, NAD Head: [place]/AT, No temporalis wasting. Ear/Nose/Throat: Hearing grossly intact, nares w/o erythema or drainage Eyes: Conjunctiva clear. Sclera non-icteric Neck: Supple.  Trachea midline Pulmonary:  Good air movement, no use of accessory muscles.  Cardiac: RRR, no JVD Vascular:  Vessel Right Left  Radial Palpable Palpable                          PT Palpable Palpable  DP Palpable Palpable   Gastrointestinal: soft, non-tender/non-distended.  Musculoskeletal: M/S 5/5 throughout.  No deformity or atrophy.  No edema. Neurologic: Sensation grossly intact in extremities.  Symmetrical.  Speech is fluent.  Psychiatric: Judgment intact, Mood & affect appropriate for pt's clinical situation. Dermatologic: No rashes or ulcers noted.  No cellulitis or open wounds.       Labs No results found for this or any previous visit (from the past 2160 hour(s)).  Radiology No results found.  Assessment/Plan Essential hypertension blood pressure control important in reducing the progression of atherosclerotic diseaseand aneurysmal degeneration. On appropriate oral medications.   Pure hypercholesterolemia lipid control important in reducing the progression of atherosclerotic disease. Continue statin  therapy  No problem-specific Assessment & Plan notes found for this encounter.    Leotis Pain, MD  10/10/2019 8:20 AM    This note was created with Dragon medical transcription system.  Any errors from dictation are purely unintentional

## 2020-10-05 ENCOUNTER — Ambulatory Visit (INDEPENDENT_AMBULATORY_CARE_PROVIDER_SITE_OTHER): Payer: Medicare Other

## 2020-10-05 ENCOUNTER — Other Ambulatory Visit: Payer: Self-pay

## 2020-10-05 ENCOUNTER — Ambulatory Visit (INDEPENDENT_AMBULATORY_CARE_PROVIDER_SITE_OTHER): Payer: Medicare Other | Admitting: Nurse Practitioner

## 2020-10-05 DIAGNOSIS — I714 Abdominal aortic aneurysm, without rupture, unspecified: Secondary | ICD-10-CM

## 2020-10-08 ENCOUNTER — Ambulatory Visit (INDEPENDENT_AMBULATORY_CARE_PROVIDER_SITE_OTHER): Payer: Medicare Other | Admitting: Vascular Surgery

## 2020-10-08 ENCOUNTER — Other Ambulatory Visit (INDEPENDENT_AMBULATORY_CARE_PROVIDER_SITE_OTHER): Payer: Medicare Other

## 2020-10-13 ENCOUNTER — Encounter (INDEPENDENT_AMBULATORY_CARE_PROVIDER_SITE_OTHER): Payer: Self-pay | Admitting: *Deleted

## 2021-02-22 ENCOUNTER — Encounter: Payer: Self-pay | Admitting: *Deleted

## 2021-02-22 ENCOUNTER — Ambulatory Visit (INDEPENDENT_AMBULATORY_CARE_PROVIDER_SITE_OTHER): Payer: Medicare Other | Admitting: Internal Medicine

## 2021-02-22 ENCOUNTER — Other Ambulatory Visit: Payer: Self-pay

## 2021-02-22 VITALS — BP 130/74 | HR 69 | Ht 76.0 in | Wt 212.8 lb

## 2021-02-22 DIAGNOSIS — I10 Essential (primary) hypertension: Secondary | ICD-10-CM | POA: Diagnosis not present

## 2021-02-22 DIAGNOSIS — R0789 Other chest pain: Secondary | ICD-10-CM | POA: Diagnosis not present

## 2021-02-22 NOTE — Patient Instructions (Signed)
Medication Instructions:  NO CHANGES *If you need a refill on your cardiac medications before your next appointment, please call your pharmacy*   Lab Work: NONE If you have labs (blood work) drawn today and your tests are completely normal, you will receive your results only by: [place] (if you have MyChart) OR A paper copy in the mail If you have any lab test that is abnormal or we need to change your treatment, we will call you to review the results.   Testing/Procedures: Your physician has requested that you have an exercise tolerance test. For further information please visit [email or web address]. Please also follow instruction sheet, as given.    Follow-Up: At [facility], you and your health needs are our priority.  As part of our continuing mission to provide you with exceptional heart care, we have created designated Provider Care Teams.  These Care Teams include your primary Cardiologist (physician) and Advanced Practice Providers (APPs -  Physician Assistants and Nurse Practitioners) who all work together to provide you with the care you need, when you need it.  We recommend signing up for the patient portal called "MyChart".  Sign up information is provided on this After Visit Summary.  MyChart is used to connect with patients for Virtual Visits (Telemedicine).  Patients are able to view lab/test results, encounter notes, upcoming appointments, etc.  Non-urgent messages can be sent to your provider as well.   To learn more about what you can do with MyChart, go to [email or web address].    Your next appointment:  PENDING STRESS RESULTS     The format for your next appointment:     Provider:      Other Instructions

## 2021-02-22 NOTE — Progress Notes (Signed)
HPI [name] returns today to re-establish after experiencing an episode of near syncope and also noting some atypical chest pain. He is a pleasant 70 yo man with a h/o HTN who has had several spells in the past which occurred when he would over heat. He had done well for several years but was riding in the car with a friend on a very hot day. The friend went inside to look at a newly constructed house and he stayed outside, began to get hot and started to feel bad. He got back in the car and sat down and felt terrible, getting clammy and sweaty and sob. He then recovered. He is fairly active and has no limit to physical activity provided it is not too hot. He has had episodes of non-exertional chest pain. He has known vascular disease and is s/p percutaneous treatment of an AAA. No other complaints today.  Allergies  Allergen Reactions   Pollen Extract Shortness Of Breath and Itching     Current Outpatient Medications  Medication Sig Dispense Refill   albuterol (PROVENTIL HFA;VENTOLIN HFA) 108 (90 Base) MCG/ACT inhaler Inhale into the lungs.     amLODipine (NORVASC) 10 MG tablet Take 10 mg by mouth daily.     aspirin 81 MG tablet Take 81 mg by mouth daily.     atorvastatin (LIPITOR) 40 MG tablet Take 1 tablet by mouth daily.     atorvastatin (LIPITOR) 40 MG tablet Take 1 tablet by mouth daily.     cetirizine (ZYRTEC) 10 MG tablet Take by mouth.     fluticasone (FLONASE) 50 MCG/ACT nasal spray Place 1-2 sprays into the nose as needed for allergies.     Folic Acid-Vit Q000111Q 123456 0.8-10-0.115 MG TABS Take by mouth.     Multiple Vitamins-Minerals (CENTRUM SILVER PO) Take 1 tablet by mouth daily.     olmesartan (BENICAR) 40 MG tablet Take 40 mg by mouth daily.     omeprazole (PRILOSEC) 20 MG capsule Take by mouth.     No current facility-administered medications for this visit.     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   Past Surgical History:  Procedure Laterality Date   ACHILLES TENDON SURGERY     COLONOSCOPY     COLONOSCOPY WITH PROPOFOL N/A 08/13/2017   Procedure: COLONOSCOPY WITH PROPOFOL;  Surgeon: Lollie Sails, MD;  Location: [facility] ENDOSCOPY;  Service: Endoscopy;  Laterality: N/A;   HERNIA REPAIR     KNEE SURGERY Right    lens repair     PERIPHERAL VASCULAR CATHETERIZATION N/A 06/07/2016   Procedure: Endovascular Repair/Stent Graft;  Surgeon: [name], MD;  Location: [place] [facility];  Service: Cardiovascular;  Laterality: N/A;     Family History  Problem Relation Age of Onset   Heart disease Mother    Aortic aneurysm Mother    Hyperlipidemia Father    Prostate cancer Neg Hx    Bladder Cancer Neg Hx    Kidney cancer Neg Hx      Social History   Socioeconomic History   Marital status: Married    Spouse name: Not on file   Number of children: Not on file   Years of education: Not on file  Highest education level: Not on file  Occupational History   Not on file  Tobacco Use   Smoking status: Never   Smokeless tobacco: Never  Vaping Use   Vaping Use: Never used  Substance and Sexual Activity   Alcohol use: Yes    Comment: 2 times weekly   Drug use: No   Sexual activity: Not on file  Other Topics Concern   Not on file  Social History Narrative   Not on file   Social Determinants of Health   Financial Resource Strain: Not on file  Food Insecurity: Not on file  Transportation Needs: Not on file  Physical Activity: Not on file  Stress: Not on file  Social Connections: Not on file  Intimate Partner Violence: Not on file     BP 130/74   Pulse 69   Ht '6\' 4"'$  (1.93 m)   Wt 212 lb 12.8 oz (96.5 kg)   SpO2 96%   BMI 25.90 kg/m   Physical Exam:  Well appearing NAD HEENT: Unremarkable Neck:  No JVD, no  thyromegally Lymphatics:  No adenopathy Back:  No CVA tenderness Lungs:  Clear HEART:  Regular rate rhythm, no murmurs, no rubs, no clicks Abd:  soft, positive bowel sounds, no organomegally, no rebound, no guarding Ext:  2 plus pulses, no edema, no cyanosis, no clubbing Skin:  No rashes no nodules Neuro:  CN II through XII intact, motor grossly intact  EKG - nsr with NSSTT changes   Assess/Plan:  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 I have asked him to avoid being outside in severe heat and to remain well hydrated, avoid caffeine and ETOH. Finally if he feels a spell coming on he is encouraged to lie down.  Atypical chest pain - He is anxious. His symptoms are atypical. He will undergo exercise testing. HTN -his bp is well controlled. We will not try and drive it lower. Dyslipidemia - he will continue his statin therapy.  Carleene Overlie Tarita Deshmukh,MD

## 2021-02-24 ENCOUNTER — Other Ambulatory Visit: Payer: Self-pay

## 2021-02-24 ENCOUNTER — Ambulatory Visit (INDEPENDENT_AMBULATORY_CARE_PROVIDER_SITE_OTHER): Payer: Medicare Other

## 2021-02-24 ENCOUNTER — Other Ambulatory Visit: Payer: Self-pay | Admitting: Nurse Practitioner

## 2021-02-24 DIAGNOSIS — R0789 Other chest pain: Secondary | ICD-10-CM | POA: Diagnosis not present

## 2021-02-24 LAB — EXERCISE TOLERANCE TEST
Angina Index: 0
Duke Treadmill Score: 6
Estimated workload: 7
Exercise duration (min): 6 min
Exercise duration (sec): 1 s
MPHR: 149 {beats}/min
Peak HR: 129 {beats}/min
Percent HR: 86 %
RPE: 15
Rest HR: 67 {beats}/min
ST Depression (mm): 0 mm

## 2021-02-25 ENCOUNTER — Telehealth: Payer: Self-pay

## 2021-02-25 MED ORDER — METOPROLOL SUCCINATE ER 25 MG PO TB24: 25.0000 mg | ORAL_TABLET | Freq: Every day | ORAL | 3 refills | Status: DC

## 2021-02-25 NOTE — Telephone Encounter (Signed)
Call placed to Pt.  Advised to start Toprol XL 25 mg PO daily and exercise daily.  Pt indicates understanding and thanked nurse for call.

## 2021-06-30 ENCOUNTER — Other Ambulatory Visit: Payer: Self-pay | Admitting: Specialist

## 2021-06-30 DIAGNOSIS — J61 Pneumoconiosis due to asbestos and other mineral fibers: Secondary | ICD-10-CM

## 2021-06-30 DIAGNOSIS — Z8616 Personal history of COVID-19: Secondary | ICD-10-CM

## 2021-07-13 ENCOUNTER — Other Ambulatory Visit: Payer: Self-pay

## 2021-07-13 ENCOUNTER — Ambulatory Visit
Admission: RE | Admit: 2021-07-13 | Discharge: 2021-07-13 | Disposition: A | Discharge status: Home / Self Care | Payer: Medicare Other | Source: Ambulatory Visit | Attending: Specialist | Admitting: Specialist

## 2021-07-13 DIAGNOSIS — J61 Pneumoconiosis due to asbestos and other mineral fibers: Secondary | ICD-10-CM | POA: Diagnosis not present

## 2021-07-13 DIAGNOSIS — Z8616 Personal history of COVID-19: Secondary | ICD-10-CM | POA: Diagnosis present

## 2021-07-19 ENCOUNTER — Other Ambulatory Visit: Payer: Self-pay | Admitting: Specialist

## 2021-07-19 DIAGNOSIS — R911 Solitary pulmonary nodule: Secondary | ICD-10-CM

## 2021-10-10 ENCOUNTER — Other Ambulatory Visit (INDEPENDENT_AMBULATORY_CARE_PROVIDER_SITE_OTHER): Payer: Self-pay | Admitting: Nurse Practitioner

## 2021-10-10 DIAGNOSIS — I714 Abdominal aortic aneurysm, without rupture, unspecified: Secondary | ICD-10-CM

## 2021-10-11 ENCOUNTER — Ambulatory Visit (INDEPENDENT_AMBULATORY_CARE_PROVIDER_SITE_OTHER): Payer: Medicare Other

## 2021-10-11 ENCOUNTER — Encounter (INDEPENDENT_AMBULATORY_CARE_PROVIDER_SITE_OTHER): Payer: Self-pay | Admitting: Vascular Surgery

## 2021-10-11 ENCOUNTER — Ambulatory Visit (INDEPENDENT_AMBULATORY_CARE_PROVIDER_SITE_OTHER): Payer: Medicare Other | Admitting: Vascular Surgery

## 2021-10-11 VITALS — BP 138/87 | HR 65 | Resp 16 | Ht 76.0 in | Wt 223.0 lb

## 2021-10-11 DIAGNOSIS — I10 Essential (primary) hypertension: Secondary | ICD-10-CM

## 2021-10-11 DIAGNOSIS — I714 Abdominal aortic aneurysm, without rupture, unspecified: Secondary | ICD-10-CM | POA: Diagnosis not present

## 2021-10-11 DIAGNOSIS — E7800 Pure hypercholesterolemia, unspecified: Secondary | ICD-10-CM

## 2021-10-11 DIAGNOSIS — I7143 Infrarenal abdominal aortic aneurysm, without rupture: Secondary | ICD-10-CM

## 2021-10-11 NOTE — Assessment & Plan Note (Signed)
Duplex today shows a patent stent graft without endoleak with a stable sac size of approximately 6.1 x 5.4 cm in maximal diameter.  Doing well.  No aneurysm related symptoms.  Continue annual follow-up. ?

## 2021-10-11 NOTE — Progress Notes (Signed)
? ? ?MRN : [id number] ? ?Joe Pena is a 71 y.o. (October 06, 1950) male who presents with chief complaint of  ?Chief Complaint  ?Patient presents with  ? Follow-up  ?  ultrasound  ?. ? ?History of Present Illness: Patient returns today in follow up of his AAA.  He is doing well.  He is 5-6 years s/p endovascular repair and doing well.  No AAA symptoms. Specifically, the patient denies new back or abdominal pain, or signs of peripheral embolization. Duplex today shows a patent stent graft without endoleak with a stable sac size of approximately 6.1 x 5.4 cm in maximal diameter. ? ?Current Outpatient Medications  ?Medication Sig Dispense Refill  ? amLODipine (NORVASC) 10 MG tablet Take 10 mg by mouth daily.    ? atorvastatin (LIPITOR) 40 MG tablet Take 1 tablet by mouth daily.    ? LAGEVRIO 200 MG CAPS capsule Take 4 capsules by mouth 2 (two) times daily.    ? metoprolol succinate (TOPROL XL) 25 MG 24 hr tablet Take 1 tablet (25 mg total) by mouth daily. 90 tablet 3  ? olmesartan (BENICAR) 40 MG tablet Take 40 mg by mouth daily.    ? pantoprazole (PROTONIX) 40 MG tablet Take 40 mg by mouth daily.    ? albuterol (PROVENTIL HFA;VENTOLIN HFA) 108 (90 Base) MCG/ACT inhaler Inhale into the lungs. (Patient not taking: Reported on 02/22/2021)    ? cetirizine (ZYRTEC) 10 MG tablet Take by mouth. (Patient not taking: Reported on 10/11/2021)    ? fluticasone (FLONASE) 50 MCG/ACT nasal spray Place 1-2 sprays into the nose as needed for allergies. (Patient not taking: Reported on 10/11/2021)    ? Multiple Vitamins-Minerals (CENTRUM SILVER PO) Take 1 tablet by mouth daily.    ? ?No current facility-administered medications for this visit.  ? ? ?Past Medical History:  ?Diagnosis Date  ? AAA (abdominal aortic aneurysm) ([name])   ? Colon polyps   ? Hyperlipidemia   ? Hypertension   ? 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 ? ?Past Surgical History:  ?Procedure Laterality Date  ? ACHILLES TENDON SURGERY    ? COLONOSCOPY    ? COLONOSCOPY WITH  PROPOFOL N/A 08/13/2017  ? Procedure: COLONOSCOPY WITH PROPOFOL;  Surgeon: Lollie Sails, MD;  Location: [facility] ENDOSCOPY;  Service: Endoscopy;  Laterality: N/A;  ? HERNIA REPAIR    ? KNEE SURGERY Right   ? lens repair    ? PERIPHERAL VASCULAR CATHETERIZATION N/A 06/07/2016  ? Procedure: Endovascular Repair/Stent Graft;  Surgeon: [name], MD;  Location: [place] [facility];  Service: Cardiovascular;  Laterality: N/A;  ? ? ? ?Social History  ? ?Tobacco Use  ? Smoking status: Never  ? Smokeless tobacco: Never  ?Vaping Use  ? Vaping Use: Never used  ?Substance Use Topics  ? Alcohol use: Yes  ?  Comment: 2 times weekly  ? Drug use: No  ? ? ? ? ?Family History  ?Problem Relation Age of Onset  ? Heart disease Mother   ? Aortic aneurysm Mother   ? Hyperlipidemia Father   ? Prostate cancer Neg Hx   ? Bladder Cancer Neg Hx   ? Kidney cancer Neg Hx   ? ? ?Allergies  ?Allergen Reactions  ? Pollen Extract Shortness Of Breath and Itching  ? ? ? ?REVIEW OF SYSTEMS (Negative unless checked) ? ?Constitutional: '[]'$ Weight loss  '[]'$ Fever  '[]'$ Chills ?Cardiac: '[]'$ Chest pain   '[]'$ Chest pressure   '[]'$ Palpitations   '[]'$ Shortness of breath when laying flat   '[]'$   Shortness of breath at rest   '[]'$ Shortness of breath with exertion. ?Vascular:  '[]'$ Pain in legs with walking   '[]'$ Pain in legs at rest   '[]'$ Pain in legs when laying flat   '[]'$ Claudication   '[]'$ Pain in feet when walking  '[]'$ Pain in feet at rest  '[]'$ Pain in feet when laying flat   '[]'$ History of DVT   '[]'$ Phlebitis   '[]'$ Swelling in legs   '[]'$ Varicose veins   '[]'$ Non-healing ulcers ?Pulmonary:   '[]'$ Uses home oxygen   '[]'$ Productive cough   '[]'$ Hemoptysis   '[]'$ Wheeze  '[]'$ COPD   '[]'$ Asthma ?Neurologic:  '[]'$ Dizziness  '[]'$ Blackouts   '[]'$ Seizures   '[]'$ History of stroke   '[]'$ History of TIA  '[]'$ Aphasia   '[]'$ Temporary blindness   '[]'$ Dysphagia   '[]'$ Weakness or numbness in arms   '[]'$ Weakness or numbness in legs ?Musculoskeletal:  '[x]'$ Arthritis   '[]'$ Joint swelling   '[x]'$ Joint pain   '[]'$ Low back pain ?Hematologic:  '[]'$ Easy bruising   '[]'$ Easy bleeding   '[]'$ Hypercoagulable state   '[]'$ Anemic   ?Gastrointestinal:  '[]'$ Blood in stool   '[]'$ Vomiting blood  '[]'$ Gastroesophageal reflux/heartburn   '[]'$ Abdominal pain ?Genitourinary:  '[]'$ Chronic kidney disease   '[]'$ Difficult urination  '[]'$ Frequent urination  '[]'$ Burning with urination   '[]'$ Hematuria ?Skin:  '[]'$ Rashes   '[]'$ Ulcers   '[]'$ Wounds ?Psychological:  '[]'$ History of anxiety   '[]'$  History of major depression. ? ?Physical Examination ? ?BP 138/87 (BP Location: Left Arm)   Pulse 65   Resp 16   Ht '6\' 4"'$  (1.93 m)   Wt 223 lb (101.2 kg)   BMI 27.14 kg/m?  ?Gen:  WD/WN, NAD ?Head: [place]/AT, No temporalis wasting. ?Ear/Nose/Throat: Hearing grossly intact, nares w/o erythema or drainage ?Eyes: Conjunctiva clear. Sclera non-icteric ?Neck: Supple.  Trachea midline ?Pulmonary:  Good air movement, no use of accessory muscles.  ?Cardiac: RRR, no JVD ?Vascular:  ?Vessel Right Left  ?Radial Palpable Palpable  ?    ?    ?    ?    ?    ?    ?PT Palpable Palpable  ?DP Palpable Palpable  ? ?Gastrointestinal: soft, non-tender/non-distended. No guarding/reflex.  ?Musculoskeletal: M/S 5/5 throughout.  No deformity or atrophy.  Gait is a little more unsteady with his diminishing vision.  No significant lower extremity edema. ?Neurologic: Sensation grossly intact in extremities.  Symmetrical.  Speech is fluent.  ?Psychiatric: Judgment intact, Mood & affect appropriate for pt's clinical situation. ?Dermatologic: No rashes or ulcers noted.  No cellulitis or open wounds. ? ? ? ? ? ?Labs ?No results found for this or any previous visit (from the past 2160 hour(s)). ? ?Radiology ?No results found. ? ?Assessment/Plan ?Essential hypertension ?blood pressure control important in reducing the progression of atherosclerotic disease and aneurysmal degeneration. On appropriate oral medications. ?  ?  ?Pure hypercholesterolemia ?lipid control important in reducing the progression of atherosclerotic disease. Continue statin therapy ? ?AAA (abdominal aortic  aneurysm) without rupture ([place]) ?Duplex today shows a patent stent graft without endoleak with a stable sac size of approximately 6.1 x 5.4 cm in maximal diameter.  Doing well.  No aneurysm related symptoms.  Continue annual follow-up. ? ? ? ?Leotis Pain, MD ? ?10/11/2021 ?11:11 AM ? ? ? ?This note was created with Dragon medical transcription system.  Any errors from dictation are purely unintentional  ?

## 2022-03-02 ENCOUNTER — Other Ambulatory Visit: Payer: Self-pay | Admitting: Internal Medicine

## 2022-03-28 ENCOUNTER — Other Ambulatory Visit: Payer: Self-pay | Admitting: Internal Medicine

## 2022-03-28 NOTE — Telephone Encounter (Signed)
Rx refill sent to pharmacy. 

## 2022-04-24 ENCOUNTER — Encounter (INDEPENDENT_AMBULATORY_CARE_PROVIDER_SITE_OTHER): Payer: Self-pay

## 2022-05-03 ENCOUNTER — Other Ambulatory Visit: Payer: Self-pay | Admitting: Internal Medicine

## 2022-05-20 ENCOUNTER — Other Ambulatory Visit: Payer: Self-pay | Admitting: Internal Medicine

## 2022-05-25 ENCOUNTER — Encounter: Payer: Self-pay | Admitting: Gastroenterology

## 2022-05-25 NOTE — H&P (Signed)
Pre-Procedure H&P   Patient ID: Joe Pena is a 71 y.o. male.  Gastroenterology Provider: Annamaria Helling, DO  Referring Provider: Donnelly Angelica, NP PCP: Kirk Ruths, MD  Date: 05/26/2022  HPI [name] is a 71 y.o. male who presents today for Esophagogastroduodenoscopy and Colonoscopy for Chronic acid reflux; surveillance-personal history of colon polyps .  Patient with longstanding acid reflux.  When he experiences this he says it improves with Tums.  He has not been regularly taking his PPI.  He denies any dysphagia or odynophagia.  No abnormal weight loss or appetite changes.  He does experience nocturnal reflux with regurgitation which is increasing in frequency. Since seeing Ms. London in the office, he has been regularly taking his ppi and noticed improvement in his sx.  Regular bowel movements without melena or hematochezia.  Personal history of polyps.  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    Past Surgical History:  Procedure Laterality Date   ACHILLES TENDON SURGERY     COLONOSCOPY     COLONOSCOPY WITH PROPOFOL N/A 08/13/2017   Procedure: COLONOSCOPY WITH PROPOFOL;  Surgeon: Lollie Sails, MD;  Location: [facility] ENDOSCOPY;  Service: Endoscopy;  Laterality: N/A;   EYE SURGERY     HERNIA REPAIR     KNEE SURGERY Right    lens repair     PERIPHERAL VASCULAR CATHETERIZATION N/A 06/07/2016   Procedure: Endovascular Repair/Stent Graft;  Surgeon: [name], MD;  Location: [facility];  Service: Cardiovascular;  Laterality: N/A;    Family History No h/o GI disease or malignancy  Review of Systems  Constitutional:   Negative for activity change, appetite change, chills, diaphoresis, fatigue, fever and unexpected weight change.  HENT:  Negative for trouble swallowing and voice change.   Respiratory:  Negative for shortness of breath and wheezing.   Cardiovascular:  Negative for chest pain, palpitations and leg swelling.  Gastrointestinal:  Negative for abdominal distention, abdominal pain, anal bleeding, blood in stool, constipation, diarrhea, nausea and vomiting.       + Reflux  Musculoskeletal:  Negative for arthralgias and myalgias.  Skin:  Negative for color change and pallor.  Neurological:  Negative for dizziness, syncope and weakness.  Psychiatric/Behavioral:  Negative for confusion. The patient is not nervous/anxious.   All other systems reviewed and are negative.    Medications No current facility-administered medications on file prior to encounter.   Current Outpatient Medications on File Prior to Encounter  Medication Sig Dispense Refill   albuterol (PROVENTIL HFA;VENTOLIN HFA) 108 (90 Base) MCG/ACT inhaler Inhale into the lungs. (Patient not taking: Reported on 02/22/2021)     amLODipine (NORVASC) 10 MG tablet Take 10 mg by mouth daily.     atorvastatin (LIPITOR) 40 MG tablet Take 1 tablet by mouth daily.     cetirizine (ZYRTEC) 10 MG tablet Take by mouth. (Patient not taking: Reported on 10/11/2021)     fluticasone (FLONASE) 50 MCG/ACT nasal spray Place 1-2 sprays into the nose as needed for allergies. (Patient not taking: Reported on 10/11/2021)     LAGEVRIO 200 MG CAPS capsule Take 4 capsules by mouth 2 (two)  times daily.     Multiple Vitamins-Minerals (CENTRUM SILVER PO) Take 1 tablet by mouth daily.     olmesartan (BENICAR) 40 MG tablet Take 40 mg by mouth daily.     pantoprazole (PROTONIX) 40 MG tablet Take 40 mg by mouth daily.      Pertinent medications related to GI and procedure were reviewed by me with the patient prior to the procedure  No current facility-administered  medications for this encounter.      Allergies  Allergen Reactions   Pollen Extract Shortness Of Breath and Itching   Allergies were reviewed by me prior to the procedure  Objective   Body mass index is 26.84 kg/m. Vitals:   05/26/22 0930  BP: (!) 142/86  Pulse: 68  Resp: 18  Temp: 97.7 F (36.5 C)  TempSrc: Temporal  SpO2: 100%  Weight: 100 kg  Height: '6\' 4"'$  (1.93 m)     Physical Exam Vitals and nursing note reviewed.  Constitutional:      General: He is not in acute distress.    Appearance: Normal appearance. He is not ill-appearing, toxic-appearing or diaphoretic.  HENT:     Head: Normocephalic and atraumatic.     Nose: Nose normal.     Mouth/Throat:     Mouth: Mucous membranes are moist.     Pharynx: Oropharynx is clear.  Eyes:     General: No scleral icterus.    Extraocular Movements: Extraocular movements intact.  Cardiovascular:     Rate and Rhythm: Normal rate and regular rhythm.     Heart sounds: Normal heart sounds. No murmur heard.    No friction rub. No gallop.  Pulmonary:     Effort: Pulmonary effort is normal. No respiratory distress.     Breath sounds: Normal breath sounds. No wheezing, rhonchi or rales.  Abdominal:     General: Bowel sounds are normal. There is no distension.     Palpations: Abdomen is soft.     Tenderness: There is no abdominal tenderness. There is no guarding or rebound.  Musculoskeletal:     Cervical back: Neck supple.     Right lower leg: No edema.     Left lower leg: No edema.  Skin:    General: Skin is warm and dry.     Coloration: Skin is not jaundiced or pale.  Neurological:     General: No focal deficit present.     Mental Status: He is alert and oriented to person, place, and time. Mental status is at baseline.  Psychiatric:        Mood and Affect: Mood normal.        Behavior: Behavior normal.        Thought Content: Thought content normal.        Judgment: Judgment normal.      Assessment:  Mr.  Joe Pena is a 71 y.o. male  who presents today for Esophagogastroduodenoscopy and Colonoscopy for Chronic acid reflux; surveillance-personal history of colon polyps .  Plan:  Esophagogastroduodenoscopy and Colonoscopy with possible intervention today  Esophagogastroduodenoscopy and Colonoscopy with possible biopsy, control of bleeding, polypectomy, and interventions as necessary has been discussed with the patient/patient representative. Informed consent was obtained from the patient/patient representative after explaining the indication, nature, and risks of the procedure including but not limited to death, bleeding, perforation, missed neoplasm/lesions, cardiorespiratory compromise, and reaction to medications. Opportunity for questions was given and appropriate answers were provided. Patient/patient representative has verbalized understanding is amenable to undergoing the  procedure.   Annamaria Helling, DO  [facility] Gastroenterology  Portions of the record may have been created with voice recognition software. Occasional wrong-word or 'sound-a-like' substitutions may have occurred due to the inherent limitations of voice recognition software.  Read the chart carefully and recognize, using context, where substitutions may have occurred.

## 2022-05-26 ENCOUNTER — Ambulatory Visit: Payer: Medicare Other | Admitting: Anesthesiology

## 2022-05-26 ENCOUNTER — Encounter
Admission: RE | Disposition: A | Discharge status: Home / Self Care | Payer: Self-pay | Source: Home / Self Care | Attending: Gastroenterology

## 2022-05-26 ENCOUNTER — Ambulatory Visit
Admission: RE | Admit: 2022-05-26 | Discharge: 2022-05-26 | Disposition: A | Discharge status: Home / Self Care | Payer: Medicare Other | Attending: Gastroenterology | Admitting: Gastroenterology

## 2022-05-26 ENCOUNTER — Encounter: Payer: Self-pay | Admitting: Gastroenterology

## 2022-05-26 DIAGNOSIS — K640 First degree hemorrhoids: Secondary | ICD-10-CM | POA: Insufficient documentation

## 2022-05-26 DIAGNOSIS — G4730 Sleep apnea, unspecified: Secondary | ICD-10-CM | POA: Diagnosis not present

## 2022-05-26 DIAGNOSIS — I10 Essential (primary) hypertension: Secondary | ICD-10-CM | POA: Diagnosis not present

## 2022-05-26 DIAGNOSIS — K449 Diaphragmatic hernia without obstruction or gangrene: Secondary | ICD-10-CM | POA: Insufficient documentation

## 2022-05-26 DIAGNOSIS — D123 Benign neoplasm of transverse colon: Secondary | ICD-10-CM | POA: Diagnosis not present

## 2022-05-26 DIAGNOSIS — K635 Polyp of colon: Secondary | ICD-10-CM | POA: Insufficient documentation

## 2022-05-26 DIAGNOSIS — D122 Benign neoplasm of ascending colon: Secondary | ICD-10-CM | POA: Diagnosis not present

## 2022-05-26 DIAGNOSIS — K219 Gastro-esophageal reflux disease without esophagitis: Secondary | ICD-10-CM | POA: Diagnosis not present

## 2022-05-26 DIAGNOSIS — K31A11 Gastric intestinal metaplasia without dysplasia, involving the antrum: Secondary | ICD-10-CM | POA: Diagnosis not present

## 2022-05-26 DIAGNOSIS — Z1211 Encounter for screening for malignant neoplasm of colon: Secondary | ICD-10-CM | POA: Diagnosis not present

## 2022-05-26 DIAGNOSIS — Z09 Encounter for follow-up examination after completed treatment for conditions other than malignant neoplasm: Secondary | ICD-10-CM | POA: Insufficient documentation

## 2022-05-26 DIAGNOSIS — K2980 Duodenitis without bleeding: Secondary | ICD-10-CM | POA: Diagnosis not present

## 2022-05-26 HISTORY — PX: ESOPHAGOGASTRODUODENOSCOPY (EGD) WITH PROPOFOL: SHX5813

## 2022-05-26 HISTORY — PX: COLONOSCOPY WITH PROPOFOL: SHX5780

## 2022-05-26 SURGERY — COLONOSCOPY WITH PROPOFOL
Anesthesia: General

## 2022-05-26 MED ORDER — LIDOCAINE HCL (CARDIAC) PF 100 MG/5ML IV SOSY
PREFILLED_SYRINGE | INTRAVENOUS | Status: DC | PRN
  Administered 2022-05-26: 50 mg via INTRAVENOUS
  Administered 2022-05-26: 100 mg via INTRAVENOUS

## 2022-05-26 MED ORDER — EPHEDRINE SULFATE (PRESSORS) 50 MG/ML IJ SOLN
INTRAMUSCULAR | Status: DC | PRN
  Administered 2022-05-26 (×2): 10 mg via INTRAVENOUS

## 2022-05-26 MED ORDER — SODIUM CHLORIDE 0.9 % IV SOLN
INTRAVENOUS | Status: DC
  Administered 2022-05-26: 1000 mL via INTRAVENOUS

## 2022-05-26 MED ORDER — PROPOFOL 10 MG/ML IV BOLUS
INTRAVENOUS | Status: DC | PRN
  Administered 2022-05-26 (×3): 50 mg via INTRAVENOUS
  Administered 2022-05-26: 40 mg via INTRAVENOUS
  Administered 2022-05-26: 50 mg via INTRAVENOUS
  Administered 2022-05-26: 40 mg via INTRAVENOUS
  Administered 2022-05-26: 30 mg via INTRAVENOUS
  Administered 2022-05-26: 40 mg via INTRAVENOUS
  Administered 2022-05-26: 50 mg via INTRAVENOUS
  Administered 2022-05-26 (×2): 40 mg via INTRAVENOUS
  Administered 2022-05-26: 50 mg via INTRAVENOUS
  Administered 2022-05-26: 40 mg via INTRAVENOUS
  Administered 2022-05-26: 130 mg via INTRAVENOUS

## 2022-05-26 NOTE — Interval H&P Note (Signed)
History and Physical Interval Note: Preprocedure H&P from 05/26/22  was reviewed and there was no interval change after seeing and examining the patient.  Written consent was obtained from the patient after discussion of risks, benefits, and alternatives. Patient has consented to proceed with Esophagogastroduodenoscopy and Colonoscopy with possible intervention   05/26/2022 9:32 AM  Joe Pena  has presented today for surgery, with the diagnosis of GERD,HX OF ADENOMATOUS POLYP OF COLON.  The various methods of treatment have been discussed with the patient and family. After consideration of risks, benefits and other options for treatment, the patient has consented to  Procedure(s): COLONOSCOPY WITH PROPOFOL (N/A) ESOPHAGOGASTRODUODENOSCOPY (EGD) WITH PROPOFOL (N/A) as a surgical intervention.  The patient's history has been reviewed, patient examined, no change in status, stable for surgery.  I have reviewed the patient's chart and labs.  Questions were answered to the patient's satisfaction.     Annamaria Helling

## 2022-05-26 NOTE — Op Note (Signed)
[facility] Gastroenterology Patient Name: Joe Pena Procedure Date: 05/26/2022 9:30 AM MRN: [id number] Account #: [id number] Date of Birth: 11-14-1950 Admit Type: Outpatient Age: 71 Room: [facility] ENDO ROOM 1 Gender: Male Note Status: Finalized Instrument Name: Upper Endoscope [id number] Procedure:             Upper GI endoscopy Indications:           Esophageal reflux Providers:             Rueben Bash, DO Referring MD:          Ocie Cornfield. Ouida Sills MD, MD (Referring MD) Medicines:             Monitored Anesthesia Care Complications:         No immediate complications. Estimated blood loss:                         Minimal. Procedure:             Pre-Anesthesia Assessment:                        - Prior to the procedure, a History and Physical was                         performed, and patient medications and allergies were                         reviewed. The patient is competent. The risks and                         benefits of the procedure and the sedation options and                         risks were discussed with the patient. All questions                         were answered and informed consent was obtained.                         Patient identification and proposed procedure were                         verified by the physician, the nurse, the anesthetist                         and the technician in the endoscopy suite. Mental                         Status Examination: alert and oriented. Airway                         Examination: normal oropharyngeal airway and neck                         mobility. Respiratory Examination: clear to                         auscultation. CV Examination: RRR, no murmurs, no S3  or S4. Prophylactic Antibiotics: The patient does not                         require prophylactic antibiotics. Prior                         Anticoagulants: The patient has taken no anticoagulant                          or antiplatelet agents. ASA Grade Assessment: III - A                         patient with severe systemic disease. After reviewing                         the risks and benefits, the patient was deemed in                         satisfactory condition to undergo the procedure. The                         anesthesia plan was to use monitored anesthesia care                         (MAC). Immediately prior to administration of                         medications, the patient was re-assessed for adequacy                         to receive sedatives. The heart rate, respiratory                         rate, oxygen saturations, blood pressure, adequacy of                         pulmonary ventilation, and response to care were                         monitored throughout the procedure. The physical                         status of the patient was re-assessed after the                         procedure.                        After obtaining informed consent, the endoscope was                         passed under direct vision. Throughout the procedure,                         the patient's blood pressure, pulse, and oxygen                         saturations were monitored continuously. The  Endosonoscope was introduced through the mouth, and                         advanced to the second part of duodenum. The upper GI                         endoscopy was accomplished without difficulty. The                         patient tolerated the procedure well. Findings:      Localized mild inflammation characterized by erosions and erythema was       found in the duodenal bulb. Biopsies were taken with a cold forceps for       histology. Estimated blood loss was minimal.      The exam of the duodenum was otherwise normal.      Scattered mild inflammation characterized by erosions and erythema was       found in the gastric antrum. Biopsies were taken with a cold  forceps for       Helicobacter pylori testing. Estimated blood loss was minimal.      A 4 cm hiatal hernia was present. Paraesophageal component noted.       Estimated blood loss: none.      The exam of the stomach was otherwise normal.      The Z-line was regular. Estimated blood loss: none.      Esophagogastric landmarks were identified: the gastroesophageal junction       was found at 36 cm from the incisors.      Abnormal motility was noted in the esophagus. The cricopharyngeus was       normal. There is spasticity of the esophageal body. The distal       esophagus/lower esophageal sphincter is open. Tertiary peristaltic waves       are noted. Estimated blood loss: none.      The exam of the esophagus was otherwise normal. Impression:            - Duodenitis. Biopsied.                        - Gastritis. Biopsied.                        - 4 cm hiatal hernia.                        - Z-line regular.                        - Esophagogastric landmarks identified.                        - Abnormal esophageal motility, suspicious for                         presbyesophagus. Recommendation:        - Patient has a contact number available for                         emergencies. The signs and symptoms of potential  delayed complications were discussed with the patient.                         Return to normal activities tomorrow. Written                         discharge instructions were provided to the patient.                        - Discharge patient to home.                        - Resume previous diet.                        - Continue present medications.                        - Await pathology results.                        - Return to GI clinic as previously scheduled.                        - Proceed with colonoscopy                        - No ibuprofen, naproxen, or other non-steroidal                         anti-inflammatory drugs.                         - The findings and recommendations were discussed with                         the patient. Procedure Code(s):     --- Professional ---                        [phone], Esophagogastroduodenoscopy, flexible,                         transoral; with biopsy, single or multiple Diagnosis Code(s):     --- Professional ---                        K29.80, Duodenitis without bleeding                        K29.70, Gastritis, unspecified, without bleeding                        K44.9, Diaphragmatic hernia without obstruction or                         gangrene                        K22.4, Dyskinesia of esophagus                        K21.9, Gastro-esophageal reflux disease without  esophagitis CPT copyright 2022 American [employer] Association. All rights reserved. The codes documented in this report are preliminary and upon coder review may  be revised to meet current compliance requirements. Attending Participation:      I personally performed the entire procedure. Volney American, DO Annamaria Helling DO, DO 05/26/2022 10:03:11 AM This report has been signed electronically. Number of Addenda: 0 Note Initiated On: 05/26/2022 9:30 AM Estimated Blood Loss:  Estimated blood loss was minimal.      [facility]

## 2022-05-26 NOTE — Transfer of Care (Signed)
Immediate Anesthesia Transfer of Care Note  Patient: Joe Pena  Procedure(s) Performed: COLONOSCOPY WITH PROPOFOL ESOPHAGOGASTRODUODENOSCOPY (EGD) WITH PROPOFOL  Patient Location: Endoscopy Unit  Anesthesia Type:General  Level of Consciousness: drowsy  Airway & Oxygen Therapy: Patient Spontanous Breathing and Patient connected to nasal cannula oxygen  Post-op Assessment: Report given to RN, Post -op Vital signs reviewed and stable, and Patient moving all extremities  Post vital signs: Reviewed and stable  Last Vitals:  Vitals Value Taken Time  BP 115/80 05/26/22 1046  Temp    Pulse 80 05/26/22 1046  Resp 22 05/26/22 1046  SpO2 99 % 05/26/22 1046    Last Pain:  Vitals:   05/26/22 0930  TempSrc: Temporal  PainSc: 0-No pain         Complications: No notable events documented.

## 2022-05-26 NOTE — Anesthesia Preprocedure Evaluation (Signed)
Anesthesia Evaluation  Patient identified by MRN, date of birth, ID band Patient awake    Reviewed: Allergy & Precautions, NPO status , Patient's Chart, lab work & pertinent test results  History of Anesthesia Complications Negative for: history of anesthetic complications  Airway Mallampati: III  TM Distance: >3 FB Neck ROM: full    Dental  (+) Chipped   Pulmonary neg shortness of breath, sleep apnea    Pulmonary exam normal        Cardiovascular Exercise Tolerance: Good hypertension, (-) angina Normal cardiovascular exam     Neuro/Psych negative neurological ROS  negative psych ROS   GI/Hepatic negative GI ROS, Neg liver ROS,neg GERD  ,,  Endo/Other  negative endocrine ROS    Renal/GU negative Renal ROS  negative genitourinary   Musculoskeletal   Abdominal   Peds  Hematology negative hematology ROS (+)   Anesthesia Other Findings Past Medical History: No date: AAA (abdominal aortic aneurysm) ([facility]) No date: Colon polyps No date: Hyperlipidemia No date: Hypertension 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  Past Surgical History: No date: ACHILLES TENDON SURGERY No date: COLONOSCOPY 08/13/2017: COLONOSCOPY WITH PROPOFOL; N/A     Comment:  Procedure: COLONOSCOPY WITH PROPOFOL;  Surgeon:               Lollie Sails, MD;  Location: [facility] ENDOSCOPY;                Service: Endoscopy;  Laterality: N/A; No date: EYE SURGERY No date: HERNIA REPAIR No date: KNEE SURGERY; Right No date: lens repair 06/07/2016: PERIPHERAL VASCULAR CATHETERIZATION; N/A     Comment:  Procedure: Endovascular Repair/Stent Graft;  Surgeon:               [name], MD;  Location: [place] [facility];                Service: Cardiovascular;  Laterality: N/A;     Reproductive/Obstetrics negative OB ROS                             Anesthesia Physical Anesthesia Plan  ASA: 3  Anesthesia Plan: General    Post-op Pain Management:    Induction: Intravenous  PONV Risk Score and Plan: Propofol infusion and TIVA  Airway Management Planned: Natural Airway and Nasal Cannula  Additional Equipment:   Intra-op Plan:   Post-operative Plan:   Informed Consent: I have reviewed the patients History and Physical, chart, labs and discussed the procedure including the risks, benefits and alternatives for the proposed anesthesia with the patient or authorized representative who has indicated his/her understanding and acceptance.     Dental Advisory Given  Plan Discussed with: Anesthesiologist, CRNA and Surgeon  Anesthesia Plan Comments: (Patient consented for risks of anesthesia including but not limited to:  - adverse reactions to medications - risk of airway placement if required - damage to eyes, teeth, lips or other oral mucosa - nerve damage due to positioning  - sore throat or hoarseness - Damage to heart, brain, nerves, lungs, other parts of body or loss of life  Patient voiced understanding.)       Anesthesia Quick Evaluation

## 2022-05-26 NOTE — Anesthesia Postprocedure Evaluation (Signed)
Anesthesia Post Note  Patient: Therman Hughlett  Procedure(s) Performed: COLONOSCOPY WITH PROPOFOL ESOPHAGOGASTRODUODENOSCOPY (EGD) WITH PROPOFOL  Patient location during evaluation: Endoscopy Anesthesia Type: General Level of consciousness: awake and alert Pain management: pain level controlled Vital Signs Assessment: post-procedure vital signs reviewed and stable Respiratory status: spontaneous breathing, nonlabored ventilation, respiratory function stable and patient connected to nasal cannula oxygen Cardiovascular status: blood pressure returned to baseline and stable Postop Assessment: no apparent nausea or vomiting Anesthetic complications: no   No notable events documented.   Last Vitals:  Vitals:   05/26/22 1056 05/26/22 1106  BP: 107/79 112/78  Pulse: 68 65  Resp: 15 14  Temp:    SpO2: 98% 98%    Last Pain:  Vitals:   05/26/22 1106  TempSrc:   PainSc: 0-No pain                 Precious Haws Malu Pellegrini

## 2022-05-26 NOTE — Op Note (Signed)
[facility] Gastroenterology Patient Name: Joe Pena Procedure Date: 05/26/2022 9:28 AM MRN: [id number] Account #: [id number] Date of Birth: September 30, 1950 Admit Type: Outpatient Age: 71 Room: [facility] ENDO ROOM 1 Gender: Male Note Status: Finalized Instrument Name: Colonoscope [id number] Procedure:             Colonoscopy Indications:           High risk colon cancer surveillance: Personal history                         of colonic polyps Providers:             Rueben Bash, DO Referring MD:          Ocie Cornfield. Ouida Sills MD, MD (Referring MD) Medicines:             Monitored Anesthesia Care Complications:         No immediate complications. Estimated blood loss:                         Minimal. Procedure:             Pre-Anesthesia Assessment:                        - Prior to the procedure, a History and Physical was                         performed, and patient medications and allergies were                         reviewed. The patient is competent. The risks and                         benefits of the procedure and the sedation options and                         risks were discussed with the patient. All questions                         were answered and informed consent was obtained.                         Patient identification and proposed procedure were                         verified by the physician, the nurse, the anesthetist                         and the technician in the endoscopy suite. Mental                         Status Examination: alert and oriented. Airway                         Examination: normal oropharyngeal airway and neck                         mobility. Respiratory Examination: clear to  auscultation. CV Examination: RRR, no murmurs, no S3                         or S4. Prophylactic Antibiotics: The patient does not                         require prophylactic antibiotics. Prior                          Anticoagulants: The patient has taken no anticoagulant                         or antiplatelet agents. ASA Grade Assessment: III - A                         patient with severe systemic disease. After reviewing                         the risks and benefits, the patient was deemed in                         satisfactory condition to undergo the procedure. The                         anesthesia plan was to use monitored anesthesia care                         (MAC). Immediately prior to administration of                         medications, the patient was re-assessed for adequacy                         to receive sedatives. The heart rate, respiratory                         rate, oxygen saturations, blood pressure, adequacy of                         pulmonary ventilation, and response to care were                         monitored throughout the procedure. The physical                         status of the patient was re-assessed after the                         procedure.                        After obtaining informed consent, the colonoscope was                         passed under direct vision. Throughout the procedure,                         the patient's blood pressure, pulse, and oxygen  saturations were monitored continuously. The                         Colonoscope was introduced through the anus and                         advanced to the the terminal ileum, with                         identification of the appendiceal orifice and IC                         valve. The colonoscopy was performed without                         difficulty. The patient tolerated the procedure well.                         The quality of the bowel preparation was evaluated                         using the BBPS [facility] Bowel Preparation Scale) with                         scores of: Right Colon = 2 (minor amount of residual                         staining, small  fragments of stool and/or opaque                         liquid, but mucosa seen well), Transverse Colon = 3                         (entire mucosa seen well with no residual staining,                         small fragments of stool or opaque liquid) and Left                         Colon = 3 (entire mucosa seen well with no residual                         staining, small fragments of stool or opaque liquid).                         The total BBPS score equals 8. The quality of the                         bowel preparation was excellent. The terminal ileum,                         ileocecal valve, appendiceal orifice, and rectum were                         photographed. Findings:      The perianal and digital rectal examinations were normal. Pertinent       negatives include normal sphincter tone.  The terminal ileum appeared normal. Estimated blood loss: none.      Retroflexion in the right colon was performed.      A 1 mm polyp was found in the ascending colon. The polyp was sessile.       The polyp was removed with a jumbo cold forceps. Resection and retrieval       were complete. Estimated blood loss was minimal.      Six sessile polyps were found in the sigmoid colon (1), descending colon       (1) and transverse colon (4). The polyps were 3 to 5 mm in size. These       polyps were removed with a cold snare. Resection and retrieval were       complete. Estimated blood loss was minimal.      The exam was otherwise without abnormality on direct and retroflexion       views.      Non-bleeding internal hemorrhoids were found during retroflexion. The       hemorrhoids were Grade I (internal hemorrhoids that do not prolapse).       Estimated blood loss: none. Impression:            - The examined portion of the ileum was normal.                        - One 1 mm polyp in the ascending colon, removed with                         a jumbo cold forceps. Resected and retrieved.                         - Six 3 to 5 mm polyps in the sigmoid colon, in the                         descending colon and in the transverse colon, removed                         with a cold snare. Resected and retrieved.                        - The examination was otherwise normal on direct and                         retroflexion views.                        - Non-bleeding internal hemorrhoids. Recommendation:        - Patient has a contact number available for                         emergencies. The signs and symptoms of potential                         delayed complications were discussed with the patient.                         Return to normal activities tomorrow. Written  discharge instructions were provided to the patient.                        - Discharge patient to home.                        - Resume previous diet.                        - Continue present medications.                        - No aspirin, ibuprofen, naproxen, or other                         non-steroidal anti-inflammatory drugs for 5 days after                         polyp removal.                        - Await pathology results.                        - Repeat colonoscopy for surveillance based on                         pathology results.                        - Return to referring physician as previously                         scheduled.                        - The findings and recommendations were discussed with                         the patient. Procedure Code(s):     --- Professional ---                        [phone], Colonoscopy, flexible; with removal of                         tumor(s), polyp(s), or other lesion(s) by snare                         technique                        [id number], 46, Colonoscopy, flexible; with biopsy, single                         or multiple Diagnosis Code(s):     --- Professional ---                        Z86.010, Personal history of colonic  polyps                        D12.2, Benign neoplasm of ascending colon  D12.5, Benign neoplasm of sigmoid colon                        D12.4, Benign neoplasm of descending colon                        D12.3, Benign neoplasm of transverse colon (hepatic                         flexure or splenic flexure) CPT copyright 2022 American Medical Association. All rights reserved. The codes documented in this report are preliminary and upon coder review may  be revised to meet current compliance requirements. Attending Participation:      I personally performed the entire procedure. Volney American, DO Annamaria Helling DO, DO 05/26/2022 10:48:25 AM This report has been signed electronically. Number of Addenda: 0 Note Initiated On: 05/26/2022 9:28 AM Scope Withdrawal Time: 0 hours 30 minutes 44 seconds  Total Procedure Duration: 0 hours 32 minutes 21 seconds  Estimated Blood Loss:  Estimated blood loss was minimal.      [facility]

## 2022-05-29 LAB — SURGICAL PATHOLOGY

## 2022-07-07 ENCOUNTER — Ambulatory Visit
Admission: RE | Admit: 2022-07-07 | Discharge: 2022-07-07 | Disposition: A | Discharge status: Home / Self Care | Payer: Medicare Other | Source: Ambulatory Visit | Attending: Specialist | Admitting: Specialist

## 2022-07-07 DIAGNOSIS — R911 Solitary pulmonary nodule: Secondary | ICD-10-CM | POA: Diagnosis present

## 2022-07-14 ENCOUNTER — Encounter: Payer: Self-pay | Admitting: Internal Medicine

## 2022-07-14 ENCOUNTER — Ambulatory Visit: Payer: Medicare Other | Attending: Cardiovascular Disease | Admitting: Internal Medicine

## 2022-07-14 VITALS — BP 110/78 | HR 59 | Ht 76.0 in | Wt 229.0 lb

## 2022-07-14 DIAGNOSIS — R55 Syncope and collapse: Secondary | ICD-10-CM | POA: Diagnosis not present

## 2022-07-14 DIAGNOSIS — G909 Disorder of the autonomic nervous system, unspecified: Secondary | ICD-10-CM | POA: Insufficient documentation

## 2022-07-14 NOTE — Patient Instructions (Addendum)
Medication Instructions:  Your physician recommends that you continue on your current medications as directed. Please refer to the Current Medication list given to you today.  *If you need a refill on your cardiac medications before your next appointment, please call your pharmacy*  Lab Work: None ordered.  If you have labs (blood work) drawn today and your tests are completely normal, you will receive your results only by: MyChart Message (if you have MyChart) OR A paper copy in the mail If you have any lab test that is abnormal or we need to change your treatment, we will call you to review the results.  Testing/Procedures: None ordered.  Follow-Up: At [facility], you and your health needs are our priority.  As part of our continuing mission to provide you with exceptional heart care, we have created designated Provider Care Teams.  These Care Teams include your primary Cardiologist (physician) and Advanced Practice Providers (APPs -  Physician Assistants and Nurse Practitioners) who all work together to provide you with the care you need, when you need it.  We recommend signing up for the patient portal called "MyChart".  Sign up information is provided on this After Visit Summary.  MyChart is used to connect with patients for Virtual Visits (Telemedicine).  Patients are able to view lab/test results, encounter notes, upcoming appointments, etc.  Non-urgent messages can be sent to your provider as well.   To learn more about what you can do with MyChart, go to [email or web address].    Your next appointment:   AS NEEDED  The format for your next appointment:   In Person  Provider:   Gregg Taylor, MD{or one of the following Advanced Practice Providers on your designated Care Team:   Renee Ursuy, PA-C Michael "Andy" Tillery, PA-C  Important Information About Sugar        

## 2022-07-14 NOTE — Progress Notes (Signed)
HPI [name] returns for followup of his autonomic dysfunction. He is a pleasant 72 yo man with a h/o HTN who has had several spells in the past which occurred when he would over heat. Since his last visit, he notes he overheated once this summer and almost passed out but ultimately got better. The patient has changed his recreational habits. He notes that his bp has been under better control. He admits to being fairly sedentary. He is not working out.  Allergies  Allergen Reactions   Pollen Extract Shortness Of Breath and Itching     Current Outpatient Medications  Medication Sig Dispense Refill   albuterol (PROVENTIL HFA;VENTOLIN HFA) 108 (90 Base) MCG/ACT inhaler Inhale into the lungs.     amLODipine (NORVASC) 10 MG tablet Take 10 mg by mouth daily.     atorvastatin (LIPITOR) 40 MG tablet Take 1 tablet by mouth daily.     cetirizine (ZYRTEC) 10 MG tablet Take by mouth.     fluticasone (FLONASE) 50 MCG/ACT nasal spray Place 1-2 sprays into the nose as needed for allergies.     LAGEVRIO 200 MG CAPS capsule Take 4 capsules by mouth 2 (two) times daily.     metoprolol succinate (TOPROL-XL) 25 MG 24 hr tablet Take 1 tablet (25 mg total) by mouth daily. Please keep upcoming appt.with Dr. Lovena Le in [place] order to receive future refills. Thank You. 90 tablet 0   Multiple Vitamins-Minerals (CENTRUM SILVER PO) Take 1 tablet by mouth daily.     olmesartan (BENICAR) 40 MG tablet Take 40 mg by mouth daily.     pantoprazole (PROTONIX) 40 MG tablet Take 40 mg by mouth daily.     No current facility-administered medications for this visit.     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   Past Surgical History:  Procedure Laterality Date   ACHILLES TENDON SURGERY     COLONOSCOPY      COLONOSCOPY WITH PROPOFOL N/A 08/13/2017   Procedure: COLONOSCOPY WITH PROPOFOL;  Surgeon: Lollie Sails, MD;  Location: [facility] ENDOSCOPY;  Service: Endoscopy;  Laterality: N/A;   COLONOSCOPY WITH PROPOFOL N/A 05/26/2022   Procedure: COLONOSCOPY WITH PROPOFOL;  Surgeon: Annamaria Helling, DO;  Location[facility] ENDOSCOPY;  Service: Gastroenterology;  Laterality: N/A;   ESOPHAGOGASTRODUODENOSCOPY (EGD) WITH PROPOFOL N/A 05/26/2022   Procedure: ESOPHAGOGASTRODUODENOSCOPY (EGD) WITH PROPOFOL;  Surgeon: Annamaria Helling, DO;  Location: [facility] ENDOSCOPY;  Service: Gastroenterology;  Laterality: N/A;   EYE SURGERY     HERNIA REPAIR     KNEE SURGERY Right    lens repair     PERIPHERAL VASCULAR CATHETERIZATION N/A 06/07/2016   Procedure: Endovascular Repair/Stent Graft;  Surgeon: [name], MD;  Location: [facility] CV LAB;  Service: Cardiovascular;  Laterality: N/A;     Family History  Problem Relation Age of Onset   Heart disease Mother    Aortic aneurysm Mother    Hyperlipidemia Father    Prostate cancer Neg Hx    Bladder Cancer Neg Hx    Kidney cancer Neg Hx      Social History   Socioeconomic History   Marital status: Married    Spouse name: Not on file   Number of children: Not on  file   Years of education: Not on file   Highest education level: Not on file  Occupational History   Not on file  Tobacco Use   Smoking status: Never   Smokeless tobacco: Never  Vaping Use   Vaping Use: Never used  Substance and Sexual Activity   Alcohol use: Yes    Comment: 2 times weekly   Drug use: No   Sexual activity: Not on file  Other Topics Concern   Not on file  Social History Narrative   Not on file   Social Determinants of Health   Financial Resource Strain: Not on file  Food Insecurity: Not on file  Transportation Needs: Not on file  Physical Activity: Not on file  Stress: Not on file  Social Connections: Not on file  Intimate Partner Violence: Not on  file     BP 110/78   Pulse (!) 59   Ht '6\' 4"'$  (1.93 m)   Wt 229 lb (103.9 kg)   SpO2 98%   BMI 27.87 kg/m   Physical Exam:  Well appearing NAD HEENT: Unremarkable Neck:  No JVD, no thyromegally Lymphatics:  No adenopathy Back:  No CVA tenderness Lungs:  Clear with no wheezes HEART:  Regular rate rhythm, no murmurs, no rubs, no clicks Abd:  soft, positive bowel sounds, no organomegally, no rebound, no guarding Ext:  2 plus pulses, no edema, no cyanosis, no clubbing Skin:  No rashes no nodules Neuro:  CN II through XII intact, motor grossly intact  EKG - sinus brady with left axis.    Assess/Plan:  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 I have asked him to avoid being outside in severe heat and to remain well hydrated, avoid caffeine and ETOH. Finally if he feels a spell coming on he is encouraged to lie down.  Atypical chest pain - this is improved.  HTN -his bp is well controlled. We will not try and drive it lower. Dyslipidemia - he will continue his statin therapy.   Carleene Overlie Candies Palm,MD

## 2022-08-19 ENCOUNTER — Other Ambulatory Visit: Payer: Self-pay | Admitting: Internal Medicine

## 2022-08-21 NOTE — Telephone Encounter (Signed)
Rx refill sent to pharmacy. 

## 2022-10-10 ENCOUNTER — Ambulatory Visit (INDEPENDENT_AMBULATORY_CARE_PROVIDER_SITE_OTHER): Payer: Medicare Other

## 2022-10-10 ENCOUNTER — Ambulatory Visit (INDEPENDENT_AMBULATORY_CARE_PROVIDER_SITE_OTHER): Payer: Medicare Other | Admitting: Vascular Surgery

## 2022-10-10 ENCOUNTER — Encounter (INDEPENDENT_AMBULATORY_CARE_PROVIDER_SITE_OTHER): Payer: Self-pay | Admitting: Vascular Surgery

## 2022-10-10 VITALS — BP 127/83 | HR 59 | Resp 18 | Ht 76.0 in | Wt 230.0 lb

## 2022-10-10 DIAGNOSIS — I10 Essential (primary) hypertension: Secondary | ICD-10-CM | POA: Diagnosis not present

## 2022-10-10 DIAGNOSIS — K429 Umbilical hernia without obstruction or gangrene: Secondary | ICD-10-CM

## 2022-10-10 DIAGNOSIS — I7143 Infrarenal abdominal aortic aneurysm, without rupture: Secondary | ICD-10-CM

## 2022-10-10 DIAGNOSIS — E7800 Pure hypercholesterolemia, unspecified: Secondary | ICD-10-CM

## 2022-10-10 NOTE — Progress Notes (Signed)
MRN : [id number]  Joe Pena is a 72 y.o. (1951-05-19) male who presents with chief complaint of  Chief Complaint  Patient presents with   Follow-up    Follow up 1 year EVAR.  [name]  History of Present Illness: Patient returns today in follow up of his aneurysm.  He is about 6 or 7 years status post endovascular repair for a greater than 8 cm abdominal aortic aneurysm.  He has developed some abdominal distention and pain and has a noticeable umbilical hernia now.  He says this popped out at 1 point it was quite large but now it seems to be easily reducible.  He does not have signs of peripheral embolization or back pain. Duplex today shows sac growth now up to 6.7 cm in maximal diameter.  No obvious endoleak was identified by duplex.  This is still far smaller than it was at the time of repair, but has grown several millimeters since his last study a year ago.   Current Outpatient Medications  Medication Sig Dispense Refill   albuterol (PROVENTIL HFA;VENTOLIN HFA) 108 (90 Base) MCG/ACT inhaler Inhale into the lungs.     amLODipine (NORVASC) 10 MG tablet Take 10 mg by mouth daily.     atorvastatin (LIPITOR) 40 MG tablet Take 1 tablet by mouth daily.     cetirizine (ZYRTEC) 10 MG tablet Take by mouth.     fluticasone (FLONASE) 50 MCG/ACT nasal spray Place 1-2 sprays into the nose as needed for allergies.     LAGEVRIO 200 MG CAPS capsule Take 4 capsules by mouth 2 (two) times daily.     metoprolol succinate (TOPROL-XL) 25 MG 24 hr tablet Take 1 tablet (25 mg total) by mouth daily. 90 tablet 3   Multiple Vitamins-Minerals (CENTRUM SILVER PO) Take 1 tablet by mouth daily.     olmesartan (BENICAR) 40 MG tablet Take 40 mg by mouth daily.     pantoprazole (PROTONIX) 40 MG tablet Take 40 mg by mouth daily.     No current facility-administered medications for this visit.    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    Past Surgical History:  Procedure Laterality Date   ACHILLES TENDON SURGERY     COLONOSCOPY     COLONOSCOPY WITH PROPOFOL N/A 08/13/2017   Procedure: COLONOSCOPY WITH PROPOFOL;  Surgeon: Christena Deem, MD;  Location: [facility] ENDOSCOPY;  Service: Endoscopy;  Laterality: N/A;   COLONOSCOPY WITH PROPOFOL N/A 05/26/2022   Procedure: COLONOSCOPY WITH PROPOFOL;  Surgeon: Jaynie Collins, DO;  Location: [facility] ENDOSCOPY;  Service: Gastroenterology;  Laterality: N/A;   ESOPHAGOGASTRODUODENOSCOPY (EGD) WITH PROPOFOL N/A 05/26/2022   Procedure: ESOPHAGOGASTRODUODENOSCOPY (EGD) WITH PROPOFOL;  Surgeon: Jaynie Collins, DO;  Location[facility] ENDOSCOPY;  Service: Gastroenterology;  Laterality: N/A;   EYE SURGERY     HERNIA REPAIR     KNEE SURGERY Right    lens repair     PERIPHERAL VASCULAR CATHETERIZATION N/A 06/07/2016   Procedure: Endovascular Repair/Stent Graft;  Surgeon: Renford Dills, MD;  Location: [facility] INVASIVE CV LAB;  Service: Cardiovascular;  Laterality: N/A;     Social History   Tobacco Use   Smoking status: Never   Smokeless tobacco: Never  Vaping Use   Vaping Use: Never used  Substance Use Topics   Alcohol use: Yes    Comment: 2 times  weekly   Drug use: No       Family History  Problem Relation Age of Onset   Heart disease Mother    Aortic aneurysm Mother    Hyperlipidemia Father    Prostate cancer Neg Hx    Bladder Cancer Neg Hx    Kidney cancer Neg Hx      Allergies  Allergen Reactions   Pollen Extract Shortness Of Breath and Itching     REVIEW OF SYSTEMS (Negative unless checked)  Constitutional: Weight loss  Fever  Chills Cardiac: Chest pain   Chest pressure   Palpitations   Shortness of breath when laying flat   Shortness of breath at rest   Shortness of breath with exertion. Vascular:  Pain in legs with walking   Pain in legs at rest   Pain in legs when laying flat    Claudication   Pain in feet when walking  Pain in feet at rest  Pain in feet when laying flat   History of DVT   Phlebitis   Swelling in legs   Varicose veins   Non-healing ulcers Pulmonary:   Uses home oxygen   Productive cough   Hemoptysis   Wheeze  COPD   Asthma Neurologic:  Dizziness  Blackouts   Seizures   History of stroke   History of TIA  Aphasia   Temporary blindness   Dysphagia   Weakness or numbness in arms   Weakness or numbness in legs Musculoskeletal:  Arthritis   Joint swelling   Joint pain   Low back pain Hematologic:  Easy bruising  Easy bleeding   Hypercoagulable state   Anemic   Gastrointestinal:  Blood in stool   Vomiting blood  Gastroesophageal reflux/heartburn   Abdominal pain Genitourinary:  Chronic kidney disease   Difficult urination  Frequent urination  Burning with urination   Hematuria Skin:  Rashes   Ulcers   Wounds Psychological:  History of anxiety    History of major depression.  Physical Examination  BP 127/83 (BP Location: Left Arm)   Pulse (!) 59   Resp 18   Ht  (1.93 m)   Wt 230 lb (104.3 kg)   BMI 28.00 kg/m  Gen:  WD/WN, NAD. Appears younger than stated age. Head: [place]/AT, No temporalis wasting. Ear/Nose/Throat: Hearing grossly intact, nares w/o erythema or drainage Eyes: Conjunctiva clear. Sclera non-icteric. Visual acuity poor.   Neck: Supple.  Trachea midline Pulmonary:  Good air movement, no use of accessory muscles.  Cardiac: RRR, no JVD Vascular:  Vessel Right Left  Radial Palpable Palpable                          PT Palpable Palpable  DP Palpable Palpable   Gastrointestinal: soft, non-tender/non-distended.  Reducible umbilical hernia is present. Musculoskeletal: M/S 5/5 throughout.  No deformity or atrophy.  No edema. Neurologic: Sensation grossly intact in extremities.  Symmetrical.  Speech is fluent.  Psychiatric: Judgment intact,  Mood & affect appropriate for pt's clinical situation. Dermatologic: No rashes or ulcers noted.  No cellulitis or open wounds.      Labs No results found for this or any previous visit (from the past 2160 hour(s)).  Radiology No results found.  Assessment/Plan Essential hypertension blood pressure control important in reducing the progression of atherosclerotic disease and aneurysmal degeneration. On appropriate oral medications.     Pure hypercholesterolemia lipid control important in reducing the progression of atherosclerotic disease. Continue statin  therapy  Umbilical hernia Patient has a symptomatic umbilical hernia.  I am going to refer him to general surgery for consideration for repair.  AAA (abdominal aortic aneurysm) without rupture ([facility]) Duplex today shows sac growth now up to 6.7 cm in maximal diameter.  No obvious endoleak was identified by duplex.  This is still far smaller than it was at the time of repair, but has grown several millimeters since his last study a year ago.  I discussed this with he and his wife today.  Shortness follow-up to 6 months with duplex and if he has continued growth, we will get a CT scan.  He could very well have a type II endoleak or some other issue that we will need to be addressed if he is having sac growth.    Festus Barren, MD  10/10/2022 10:40 AM    This note was created with Dragon medical transcription system.  Any errors from dictation are purely unintentional

## 2022-10-10 NOTE — Assessment & Plan Note (Signed)
Duplex today shows sac growth now up to 6.7 cm in maximal diameter.  No obvious endoleak was identified by duplex.  This is still far smaller than it was at the time of repair, but has grown several millimeters since his last study a year ago.  I discussed this with he and his wife today.  Shortness follow-up to 6 months with duplex and if he has continued growth, we will get a CT scan.  He could very well have a type II endoleak or some other issue that we will need to be addressed if he is having sac growth.

## 2022-10-10 NOTE — Assessment & Plan Note (Signed)
Patient has a symptomatic umbilical hernia.  I am going to refer him to general surgery for consideration for repair.

## 2022-10-17 ENCOUNTER — Other Ambulatory Visit (INDEPENDENT_AMBULATORY_CARE_PROVIDER_SITE_OTHER): Payer: Self-pay | Admitting: Nurse Practitioner

## 2022-10-17 ENCOUNTER — Telehealth (INDEPENDENT_AMBULATORY_CARE_PROVIDER_SITE_OTHER): Payer: Self-pay | Admitting: Vascular Surgery

## 2022-10-17 DIAGNOSIS — K429 Umbilical hernia without obstruction or gangrene: Secondary | ICD-10-CM

## 2022-10-17 NOTE — Telephone Encounter (Signed)
Patient notified

## 2022-10-17 NOTE — Telephone Encounter (Signed)
Pt LVM inquiring about the referral for the hernia. States that Dr. Wyn Quaker was going to place a referral for him. Please advise.

## 2022-10-17 NOTE — Telephone Encounter (Signed)
Can you place referral to Dr Hazle Quant for patient

## 2022-10-17 NOTE — Telephone Encounter (Signed)
Referral placed.

## 2022-11-03 ENCOUNTER — Ambulatory Visit: Payer: Self-pay | Admitting: General Surgery

## 2022-11-03 NOTE — H&P (View-Only) (Signed)
PATIENT PROFILE: Joe Pena is a 72 y.o. male who presents to the Clinic for consultation at the request of Joe Pena for evaluation of umbilical hernia.   PCP:  Anderson, Marshall Wilson III, MD   HISTORY OF PRESENT ILLNESS: [name] reports feeling hernia for the last few months.  He endorses that the umbilical hernia is causing him discomfort.  Disease aggravated by applying pressure.  No alleviating factors.  He also endorses that the hernia is increasing in size.  Pain localized to the periumbilical area.  No pain radiation     PROBLEM LIST: Problem List  Date Reviewed: 07/08/2021            Noted    Prediabetes 12/21/2021    Essential tremor 12/21/2021    Overview      Very mild tremor noted and exposure to contaminated water noted in the past        OSA on CPAP 02/10/2019    PVD (peripheral vascular disease) (CMS-HCC) 05/29/2018    Overview      Post aaa stent, atorvastatin         History of endovascular stent graft for abdominal aortic aneurysm (AAA) 06/06/2016    Overview      Last Assessment & Plan:  Is doing well about 2 weeks after endovascular abdominal aortic aneurysm repair. Return to normal activities as tolerated without restrictions. Continue aspirin and Lipitor. Return in 3 months with duplex        Health care maintenance 03/21/2016    Overview      Colon polyp hx so due 2018. PSA yearly with fam hx. Zostervax rx given 11-15, pneumovax 2016, flu yearly     Health maintenance exam, Medicare   ROVIDERS RENDERING CARE Dr Anderson, Optho in [place], Vascular    FUNCTIONAL ASSESSMENT  (1) Hearing: Demonstrates normal hearing in conversation.  (2) Risk of Falls: No reports of falls or abnormal balance. Gait is observed to be good upon observation.  (3) Home Safety; Home is safe and secure (4) Activities of Daily Living; Household chores and grooming are managed without problems. Personal finances are managed without problems.    DEPRESSION SCREENING There  does not seem to be loss of interest in activities nor excess crying or changes in sleep or appetite.    COGNITIVE SCREENING Orientation is appropriate as are responses to questions and general conversation. No reports of forgetfulness or losing things.      PREVENTION PLAN Cardiovascular; Followed with elevation history Diabetes; yearly   Colon Cancer; 2013 with polyps and 2-19 with polyps  Glaucoma; Yearly or more with retinosa pigmentosa Pneumonia; 2016 pneumovax and 11-17 prevnar   Shingles: Zostavax 2015 and shingrix 12-19 Covid; Has had  Influenza; Yearly each fall Smoking Cessation; NA     OTHER PERSONALIZED HEALTH ADVISE Walking and healthy fat diet.   END OF LIFE CARE WANTS Full Code.    Marshall Anderson MD            Pure hypercholesterolemia 05/14/2014    Essential hypertension 04/26/2014      GENERAL REVIEW OF SYSTEMS:    General ROS: negative for - chills, fatigue, fever, weight gain or weight loss Allergy and Immunology ROS: negative for - hives  Hematological and Lymphatic ROS: negative for - bleeding problems or bruising, negative for palpable nodes Endocrine ROS: negative for - heat or cold intolerance, hair changes Respiratory ROS: negative for - cough, shortness of breath or wheezing Cardiovascular ROS: no chest pain or   palpitations GI ROS: negative for nausea, vomiting, abdominal pain, diarrhea, constipation Musculoskeletal ROS: negative for - joint swelling or muscle pain Neurological ROS: negative for - confusion, syncope Dermatological ROS: negative for pruritus and rash Psychiatric: negative for anxiety, depression, difficulty sleeping and memory loss   MEDICATIONS: Current Medications        Current Outpatient Medications  Medication Sig Dispense Refill   albuterol 90 mcg/actuation inhaler Inhale 2 inhalations into the lungs every 2 (two) hours as needed for Wheezing (Please fill asap as prescribed, I reviewed your message) 8 g 0    amLODIPine (NORVASC) 10 MG tablet TAKE 1 TABLET BY MOUTH EVERY DAY 90 tablet 1   atorvastatin (LIPITOR) 40 MG tablet TAKE 1 TABLET BY MOUTH EVERY DAY 90 tablet 1   azelastine (ASTELIN) 137 mcg nasal spray Place 2 sprays into both nostrils 2 (two) times daily 30 mL 11   metoprolol succinate (TOPROL-XL) 25 MG XL tablet Take 1 tablet (25 mg total) by mouth once daily       multivit-min/FA/lycopen/lutein (CENTRUM SILVER MEN ORAL) Take 1 tablet by mouth once daily          olmesartan (BENICAR) 40 MG tablet TAKE 1 TABLET BY MOUTH EVERY DAY 90 tablet 1   pantoprazole (PROTONIX) 40 MG DR tablet TAKE ONE TABLET BY MOUTH ONE TIME DAILY 90 tablet 1    No current facility-administered medications for this visit.        ALLERGIES: Patient has no known allergies.   PAST MEDICAL HISTORY: Past Medical History      Past Medical History:  Diagnosis Date   Colon polyp     Hypertension     RP (retinitis pigmentosa)     Sleep apnea          PAST SURGICAL HISTORY: Past Surgical History       Past Surgical History:  Procedure Laterality Date   ENDOVASCULAR REPAIR INFRARENAL ABDOMINAL AORTIC ANEURYSM   04/2016    DONE BY [facility]   COLONOSCOPY   08/13/2017    Tubular adenoma of the colon/Repeat 3yrs/MUS   Colon @ [facility]   05/26/2022    Tubular adenomas/Hyperplastic polyp/PHx CP/Repeat 3yrs/SMR   EGD @ [facility]   05/26/2022    Duodenitis/Intestinal metaplasia/Gastritis/Repeat 1yr/SMR   COLONOSCOPY       HERNIA REPAIR       LENS EYE SURGERY Bilateral 4 YEARS AGO    s/p CE/IOL [facility]        FAMILY HISTORY: Family History        Family History  Problem Relation Name Age of Onset   High blood pressure (Hypertension) Father       Prostate cancer Brother       Cataracts Sister (cat sx)     Glaucoma Neg Hx       Macular degeneration Neg Hx       Colon cancer Neg Hx       Colon polyps Neg Hx            SOCIAL HISTORY: Social History  Social History          Socioeconomic History   Marital status: Married  Occupational History   Occupation: retired marine  Tobacco Use   Smoking status: Never   Smokeless tobacco: Never  Vaping Use   Vaping status: Never Used  Substance and Sexual Activity   Alcohol use: No   Drug use: No   Sexual activity: Yes        Partners: Female  Social History Narrative    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 Weight: (!) 103.4 kg (228 lb)    GENERAL: Alert, active, oriented x3   HEENT: Pupils equal reactive to light. Extraocular movements are intact. Sclera clear. Palpebral conjunctiva normal red color.Pharynx clear.   NECK: Supple with no palpable mass and no adenopathy.   LUNGS: Sound clear with no rales rhonchi or wheezes.   HEART: Regular rhythm S1 and S2 without murmur.   ABDOMEN: Soft and depressible, nontender with no palpable mass, no hepatomegaly.    EXTREMITIES: Well-developed well-nourished symmetrical with no dependent edema.   NEUROLOGICAL: Awake alert oriented, facial expression symmetrical, moving all extremities.   REVIEW OF DATA: I have reviewed the following data today:      No visits with results within 3 Month(s) from this visit.  Latest known visit with results is:  Appointment on 05/30/2022  Component Date Value   Hemoglobin A1C 05/30/2022 5.9 (H)    Average Blood Glucose (C* 05/30/2022 123    Glucose 05/30/2022 91    Sodium 05/30/2022 142    Potassium 05/30/2022 4.1    Chloride 05/30/2022 107    Carbon Dioxide (CO2) 05/30/2022 29.2    Urea Nitrogen (BUN) 05/30/2022 15    Creatinine 05/30/2022 1.3    Glomerular Filtration Ra* 05/30/2022 58 (L)    Calcium 05/30/2022 8.6 (L)    AST  05/30/2022 19    ALT  05/30/2022 18    Alk Phos (alkaline Phosp* 05/30/2022 80    Albumin 05/30/2022 3.9    Bilirubin, Total 05/30/2022 0.7    Protein, Total 05/30/2022 6.4     A/G Ratio 05/30/2022 1.6    Cholesterol, Total 05/30/2022 126    Triglyceride 05/30/2022 91    HDL (High Density Lipopr* 05/30/2022 31.5    LDL Calculated 05/30/2022 76    VLDL Cholesterol 05/30/2022 18    Cholesterol/HDL Ratio 05/30/2022 4.0       ASSESSMENT: Mr. Brau is a 72 y.o. male presenting for consultation for umbilical hernia.     The patient presents with a symptomatic, incarcerated umbilical hernia. Patient was oriented about the diagnosis of umbilical hernia and its implication. The patient was oriented about the treatment alternatives (observation vs surgical repair). Due to patient symptoms, repair is recommended. Patient oriented about the surgical procedure, the use of mesh and its risk of complications such as: infection, bleeding, injury to vasculature, injury to bowel or bladder, and chronic pain, intestinal obstruction, among others.    I discussed case with vascular surgeon.  He endorses that he should be safe to proceed with hernia repair even before neck CT scan in October.  Will also discuss care with the anesthesia team for complete preparation and planning.  Case discussed with Dr. Dew who says that patient is low risk for complication from aneurysm standpoint.    Umbilical hernia, incarcerated [K42.0]   PLAN: We discussed about umbilical hernia repair (49594)    Patient and his wife verbalized understanding, all questions were answered, and were agreeable with the plan outlined above.      Taray Normoyle Cintron-Diaz, MD   Electronically signed by Acadia Thammavong Cintron-Diaz, MD  

## 2022-11-03 NOTE — H&P (Signed)
PATIENT PROFILE: Joe Pena is a 73 y.o. male who presents to the Clinic for consultation at the request of Manson Passey, NP for evaluation of umbilical hernia.   PCP:  Allyn Kenner, MD   HISTORY OF PRESENT ILLNESS: Joe Pena reports feeling hernia for the last few months.  He endorses that the umbilical hernia is causing him discomfort.  Disease aggravated by applying pressure.  No alleviating factors.  He also endorses that the hernia is increasing in size.  Pain localized to the periumbilical area.  No pain radiation     PROBLEM LIST: Problem List  Date Reviewed: 07/08/2021            Noted    Prediabetes 12/21/2021    Essential tremor 12/21/2021    Overview      Very mild tremor noted and exposure to contaminated water noted in the past        OSA on CPAP 02/10/2019    PVD (peripheral vascular disease) (CMS-HCC) 05/29/2018    Overview      Post aaa stent, atorvastatin         History of endovascular stent graft for abdominal aortic aneurysm (AAA) 06/06/2016    Overview      Last Assessment & Plan:  Is doing well about 2 weeks after endovascular abdominal aortic aneurysm repair. Return to normal activities as tolerated without restrictions. Continue aspirin and Lipitor. Return in 3 months with duplex        Health care maintenance 03/21/2016    Overview      Colon polyp hx so due 2018. PSA yearly with fam hx. Zostervax rx given 11-15, pneumovax 2016, flu yearly     Health maintenance exam, Medicare   ROVIDERS RENDERING CARE Dr Dareen Piano, Optho in [place], Vascular    FUNCTIONAL ASSESSMENT  (1) Hearing: Demonstrates normal hearing in conversation.  (2) Risk of Falls: No reports of falls or abnormal balance. Gait is observed to be good upon observation.  (3) Home Safety; Home is safe and secure (4) Activities of Daily Living; Household chores and grooming are managed without problems. Personal finances are managed without problems.    DEPRESSION SCREENING There  does not seem to be loss of interest in activities nor excess crying or changes in sleep or appetite.    COGNITIVE SCREENING Orientation is appropriate as are responses to questions and general conversation. No reports of forgetfulness or losing things.      PREVENTION PLAN Cardiovascular; Followed with elevation history Diabetes; yearly   Colon Cancer; 2013 with polyps and 2-19 with polyps  Glaucoma; Yearly or more with retinosa pigmentosa Pneumonia; 2016 pneumovax and 11-17 prevnar   Shingles: Zostavax 2015 and shingrix 12-19 Covid; Has had  Influenza; Yearly each fall Smoking Cessation; NA     OTHER PERSONALIZED HEALTH ADVISE Walking and healthy fat diet.   END OF LIFE CARE WANTS Full Code.    Einar Crow MD            Pure hypercholesterolemia 05/14/2014    Essential hypertension 04/26/2014      GENERAL REVIEW OF SYSTEMS:    General ROS: negative for - chills, fatigue, fever, weight gain or weight loss Allergy and Immunology ROS: negative for - hives  Hematological and Lymphatic ROS: negative for - bleeding problems or bruising, negative for palpable nodes Endocrine ROS: negative for - heat or cold intolerance, hair changes Respiratory ROS: negative for - cough, shortness of breath or wheezing Cardiovascular ROS: no chest pain or  palpitations GI ROS: negative for nausea, vomiting, abdominal pain, diarrhea, constipation Musculoskeletal ROS: negative for - joint swelling or muscle pain Neurological ROS: negative for - confusion, syncope Dermatological ROS: negative for pruritus and rash Psychiatric: negative for anxiety, depression, difficulty sleeping and memory loss   MEDICATIONS: Current Medications        Current Outpatient Medications  Medication Sig Dispense Refill   albuterol 90 mcg/actuation inhaler Inhale 2 inhalations into the lungs every 2 (two) hours as needed for Wheezing (Please fill asap as prescribed, I reviewed your message) 8 g 0    amLODIPine (NORVASC) 10 MG tablet TAKE 1 TABLET BY MOUTH EVERY DAY 90 tablet 1   atorvastatin (LIPITOR) 40 MG tablet TAKE 1 TABLET BY MOUTH EVERY DAY 90 tablet 1   azelastine (ASTELIN) 137 mcg nasal spray Place 2 sprays into both nostrils 2 (two) times daily 30 mL 11   metoprolol succinate (TOPROL-XL) 25 MG XL tablet Take 1 tablet (25 mg total) by mouth once daily       multivit-min/FA/lycopen/lutein (CENTRUM SILVER MEN ORAL) Take 1 tablet by mouth once daily          olmesartan (BENICAR) 40 MG tablet TAKE 1 TABLET BY MOUTH EVERY DAY 90 tablet 1   pantoprazole (PROTONIX) 40 MG DR tablet TAKE ONE TABLET BY MOUTH ONE TIME DAILY 90 tablet 1    No current facility-administered medications for this visit.        ALLERGIES: Patient has no known allergies.   PAST MEDICAL HISTORY: Past Medical History      Past Medical History:  Diagnosis Date   Colon polyp     Hypertension     RP (retinitis pigmentosa)     Sleep apnea          PAST SURGICAL HISTORY: Past Surgical History       Past Surgical History:  Procedure Laterality Date   ENDOVASCULAR REPAIR INFRARENAL ABDOMINAL AORTIC ANEURYSM   04/2016    DONE BY [facility]   COLONOSCOPY   08/13/2017    Tubular adenoma of the colon/Repeat 68yrs/MUS   Colon @ [facility]   05/26/2022    Tubular adenomas/Hyperplastic polyp/PHx CP/Repeat 25yrs/SMR   EGD @ [facility]   05/26/2022    Duodenitis/Intestinal metaplasia/Gastritis/Repeat 4yr/SMR   COLONOSCOPY       HERNIA REPAIR       LENS EYE SURGERY Bilateral 4 YEARS AGO    s/p CE/IOL [facility]        FAMILY HISTORY: Family History        Family History  Problem Relation Name Age of Onset   High blood pressure (Hypertension) Father       Prostate cancer Brother       Cataracts Sister (cat sx)     Glaucoma Neg Hx       Macular degeneration Neg Hx       Colon cancer Neg Hx       Colon polyps Neg Hx            SOCIAL HISTORY: Social History  Social History          Socioeconomic History   Marital status: Married  Occupational History   Occupation: retired Arts development officer  Tobacco Use   Smoking status: Never   Smokeless tobacco: Never  Vaping Use   Vaping status: Never Used  Substance and Sexual Activity   Alcohol use: No   Drug use: No   Sexual activity: Yes  Partners: Female  Social History Narrative    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 Weight: (!) 103.4 kg (228 lb)    GENERAL: Alert, active, oriented x3   HEENT: Pupils equal reactive to light. Extraocular movements are intact. Sclera clear. Palpebral conjunctiva normal red color.Pharynx clear.   NECK: Supple with no palpable mass and no adenopathy.   LUNGS: Sound clear with no rales rhonchi or wheezes.   HEART: Regular rhythm S1 and S2 without murmur.   ABDOMEN: Soft and depressible, nontender with no palpable mass, no hepatomegaly.    EXTREMITIES: Well-developed well-nourished symmetrical with no dependent edema.   NEUROLOGICAL: Awake alert oriented, facial expression symmetrical, moving all extremities.   REVIEW OF DATA: I have reviewed the following data today:      No visits with results within 3 Month(s) from this visit.  Latest known visit with results is:  Appointment on 05/30/2022  Component Date Value   Hemoglobin A1C 05/30/2022 5.9 (H)    Average Blood Glucose (C* 05/30/2022 123    Glucose 05/30/2022 91    Sodium 05/30/2022 142    Potassium 05/30/2022 4.1    Chloride 05/30/2022 107    Carbon Dioxide (CO2) 05/30/2022 29.2    Urea Nitrogen (BUN) 05/30/2022 15    Creatinine 05/30/2022 1.3    Glomerular Filtration Ra* 05/30/2022 58 (L)    Calcium 05/30/2022 8.6 (L)    AST  05/30/2022 19    ALT  05/30/2022 18    Alk Phos (alkaline Phosp* 05/30/2022 80    Albumin 05/30/2022 3.9    Bilirubin, Total 05/30/2022 0.7    Protein, Total 05/30/2022 6.4     A/G Ratio 05/30/2022 1.6    Cholesterol, Total 05/30/2022 126    Triglyceride 05/30/2022 91    HDL (High Density Lipopr* 16/03/9603 31.5    LDL Calculated 05/30/2022 76    VLDL Cholesterol 05/30/2022 18    Cholesterol/HDL Ratio 05/30/2022 4.0       ASSESSMENT: Mr. Clucas is a 72 y.o. male presenting for consultation for umbilical hernia.     The patient presents with a symptomatic, incarcerated umbilical hernia. Patient was oriented about the diagnosis of umbilical hernia and its implication. The patient was oriented about the treatment alternatives (observation vs surgical repair). Due to patient symptoms, repair is recommended. Patient oriented about the surgical procedure, the use of mesh and its risk of complications such as: infection, bleeding, injury to vasculature, injury to bowel or bladder, and chronic pain, intestinal obstruction, among others.    I discussed case with vascular surgeon.  He endorses that he should be safe to proceed with hernia repair even before neck CT scan in October.  Will also discuss care with the anesthesia team for complete preparation and planning.  Case discussed with Dr. Wyn Quaker who says that patient is low risk for complication from aneurysm standpoint.    Umbilical hernia, incarcerated [K42.0]   PLAN: We discussed about umbilical hernia repair (54098)    Patient and his wife verbalized understanding, all questions were answered, and were agreeable with the plan outlined above.      Carolan Shiver, MD   Electronically signed by Carolan Shiver, MD

## 2022-11-17 ENCOUNTER — Telehealth: Payer: Self-pay | Admitting: *Deleted

## 2022-11-17 ENCOUNTER — Other Ambulatory Visit: Payer: Self-pay

## 2022-11-17 ENCOUNTER — Telehealth: Payer: Self-pay

## 2022-11-17 ENCOUNTER — Encounter
Admission: RE | Admit: 2022-11-17 | Discharge: 2022-11-17 | Disposition: A | Discharge status: Home / Self Care | Payer: Medicare Other | Source: Ambulatory Visit | Attending: General Surgery | Admitting: General Surgery

## 2022-11-17 DIAGNOSIS — Z01812 Encounter for preprocedural laboratory examination: Secondary | ICD-10-CM

## 2022-11-17 DIAGNOSIS — I10 Essential (primary) hypertension: Secondary | ICD-10-CM

## 2022-11-17 HISTORY — DX: Angina pectoris, unspecified: I20.9

## 2022-11-17 HISTORY — DX: Sleep apnea, unspecified: G47.30

## 2022-11-17 HISTORY — DX: Peripheral vascular disease, unspecified: I73.9

## 2022-11-17 NOTE — Telephone Encounter (Signed)
   Name: Joe Pena  DOB: August 22, 1950  MRN: [id number]  Primary Cardiologist: None   Preoperative team, please contact this patient and set up a phone call appointment for further preoperative risk assessment. Please obtain consent and complete medication review. Thank you for your help.  I confirm that guidance regarding antiplatelet and oral anticoagulation therapy has been completed and, if necessary, noted below.  None Requested   Napoleon Form, Leodis Rains, NP 11/17/2022, 12:58 PM [facility]

## 2022-11-17 NOTE — Addendum Note (Signed)
Addended by: Anselm Lis A on: 11/17/2022 02:27 PM   Modules accepted: Orders

## 2022-11-17 NOTE — Telephone Encounter (Signed)
Med rec and consent done

## 2022-11-17 NOTE — Telephone Encounter (Signed)
-----   Message from Verlee Monte, NP sent at 11/17/2022 12:07 PM EDT ----- Regarding: Request for pre-operative cardiac clearance Request for pre-operative cardiac clearance:  1. What type of surgery is being performed?  HERNIA REPAIR UMBILICAL ADULT  2. When is this surgery scheduled?  11/27/2022  3. Type of clearance being requested (medical, pharmacy, both)? MEDICAL   4. Are there any medications that need to be held prior to surgery? NONE  5. Practice name and name of physician performing surgery?  Performing surgeon: Dr. Festus Barren, MD Requesting clearance: Quentin Mulling, FNP-C    6. Anesthesia type (none, local, MAC, general)? GENERAL  7. What is the office phone and fax number?   Fax: [phone]  ATTENTION: Unable to create telephone message as per your standard workflow. Directed by [facility] providers to send requests for cardiac clearance to this pool for appropriate distribution to provider covering pre-operative clearances.   Quentin Mulling, MSN, APRN, FNP-C, CEN [facility]  Peri-operative Services Nurse Practitioner Phone: [phone] 11/17/22 12:07 PM

## 2022-11-17 NOTE — Telephone Encounter (Signed)
  Patient Consent for Virtual Visit        [name] has provided verbal consent on 11/17/2022 for a virtual visit (video or telephone).   CONSENT FOR VIRTUAL VISIT FOR:  Joe Pena  By participating in this virtual visit I agree to the following:  I hereby voluntarily request, consent and authorize [facility] and its employed or contracted physicians, physician assistants, nurse practitioners or other licensed health care professionals (the Practitioner), to provide me with telemedicine health care services (the "Services") as deemed necessary by the treating Practitioner. I acknowledge and consent to receive the Services by the Practitioner via telemedicine. I understand that the telemedicine visit will involve communicating with the Practitioner through live audiovisual communication technology and the disclosure of certain medical information by electronic transmission. I acknowledge that I have been given the opportunity to request an in-person assessment or other available alternative prior to the telemedicine visit and am voluntarily participating in the telemedicine visit.  I understand that I have the right to withhold or withdraw my consent to the use of telemedicine in the course of my care at any time, without affecting my right to future care or treatment, and that the Practitioner or I may terminate the telemedicine visit at any time. I understand that I have the right to inspect all information obtained and/or recorded in the course of the telemedicine visit and may receive copies of available information for a reasonable fee.  I understand that some of the potential risks of receiving the Services via telemedicine include:  Delay or interruption in medical evaluation due to technological equipment failure or disruption; Information transmitted may not be sufficient (e.g. poor resolution of images) to allow for appropriate medical decision making by the  Practitioner; and/or  In rare instances, security protocols could fail, causing a breach of personal health information.  Furthermore, I acknowledge that it is my responsibility to provide information about my medical history, conditions and care that is complete and accurate to the best of my ability. I acknowledge that Practitioner's advice, recommendations, and/or decision may be based on factors not within their control, such as incomplete or inaccurate data provided by me or distortions of diagnostic images or specimens that may result from electronic transmissions. I understand that the practice of medicine is not an exact science and that Practitioner makes no warranties or guarantees regarding treatment outcomes. I acknowledge that a copy of this consent can be made available to me via my patient portal [facility] MyChart), or I can request a printed copy by calling the office of [facility].    I understand that my insurance will be billed for this visit.   I have read or had this consent read to me. I understand the contents of this consent, which adequately explains the benefits and risks of the Services being provided via telemedicine.  I have been provided ample opportunity to ask questions regarding this consent and the Services and have had my questions answered to my satisfaction. I give my informed consent for the services to be provided through the use of telemedicine in my medical care

## 2022-11-17 NOTE — Patient Instructions (Addendum)
Your procedure is scheduled on: 11/27/22 - Monday Report to the Registration Desk on the 1st floor of the [facility]. To find out your arrival time, please call [phone] between 1PM - 3PM on: 11/24/22 - Friday If your arrival time is 6:00 am, do not arrive before that time as the [facility] entrance doors do not open until 6:00 am.  REMEMBER: Instructions that are not followed completely may result in serious medical risk, up to and including death; or upon the discretion of your surgeon and anesthesiologist your surgery may need to be rescheduled.  Do not eat food or drink any liquids after midnight the night before surgery.  No gum chewing or hard candies.  One week prior to surgery: Stop taking beginning 11/20/22, Anti-inflammatories (NSAIDS) such as Advil, Aleve, Ibuprofen, Motrin, Naproxen, Naprosyn and Aspirin based products such as Excedrin, Goody's Powder, BC Powder.  Stop taking beginning 11/20/22, ANY OVER THE COUNTER supplements until after surgery.  You may take Tylenol if needed for pain up until the day of surgery.  Continue taking all prescribed medications with the exception of the following:  - HOLD olmesartan (BENICAR) on the day of surgery   TAKE ONLY THESE MEDICATIONS THE MORNING OF SURGERY WITH A SIP OF WATER:  pantoprazole (PROTONIX)- (take one the night before and one on the morning of surgery - helps to prevent nausea after surgery.) metoprolol succinate (TOPROL-XL)  amLODipine (NORVASC)   No Alcohol for 24 hours before or after surgery.  No Smoking including e-cigarettes for 24 hours before surgery.  No chewable tobacco products for at least 6 hours before surgery.  No nicotine patches on the day of surgery.  Do not use any "recreational" drugs for at least a week (preferably 2 weeks) before your surgery.  Please be advised that the combination of cocaine and anesthesia may have negative outcomes, up to and including death. If you test positive  for cocaine, your surgery will be cancelled.  On the morning of surgery brush your teeth with toothpaste and water, you may rinse your mouth with mouthwash if you wish. Do not swallow any toothpaste or mouthwash.  Use CHG Soap or wipes as directed on instruction sheet.  Do not wear jewelry, make-up, hairpins, clips or nail polish.  Do not wear lotions, powders, or perfumes.   Do not shave body hair from the neck down 48 hours before surgery.  Contact lenses, hearing aids and dentures may not be worn into surgery.  Do not bring valuables to the hospital. [facility] is not responsible for any missing/lost belongings or valuables.   Notify your doctor if there is any change in your medical condition (cold, fever, infection).  Wear comfortable clothing (specific to your surgery type) to the hospital.  After surgery, you can help prevent lung complications by doing breathing exercises.  Take deep breaths and cough every 1-2 hours. Your doctor may order a device called an Incentive Spirometer to help you take deep breaths. When coughing or sneezing, hold a pillow firmly against your incision with both hands. This is called "splinting." Doing this helps protect your incision. It also decreases belly discomfort.  If you are being admitted to the hospital overnight, leave your suitcase in the car. After surgery it may be brought to your room.  In case of increased patient census, it may be necessary for you, the patient, to continue your postoperative care in the Same Day Surgery department.  If you are being discharged the day of  surgery, you will not be allowed to drive home. You will need a responsible individual to drive you home and stay with you for 24 hours after surgery.   If you are taking public transportation, you will need to have a responsible individual with you.  Please call the Pre-admissions Testing Dept. at [phone] if you have any questions about these  instructions.  Surgery Visitation Policy:  Patients having surgery or a procedure may have two visitors.  Children under the age of 74 must have an adult with them who is not the patient.  Inpatient Visitation:    Visiting hours are 7 a.m. to 8 p.m. Up to four visitors are allowed at one time in a patient room. The visitors may rotate out with other people during the day.  One visitor age 9 or older may stay with the patient overnight and must be in the room by 8 p.m.    Preparing for Surgery with CHLORHEXIDINE GLUCONATE (CHG) Soap  Chlorhexidine Gluconate (CHG) Soap  o An antiseptic cleaner that kills germs and bonds with the skin to continue killing germs even after washing  o Used for showering the night before surgery and morning of surgery  Before surgery, you can play an important role by reducing the number of germs on your skin.  CHG (Chlorhexidine gluconate) soap is an antiseptic cleanser which kills germs and bonds with the skin to continue killing germs even after washing.  Please do not use if you have an allergy to CHG or antibacterial soaps. If your skin becomes reddened/irritated stop using the CHG.  1. Shower the NIGHT BEFORE SURGERY and the MORNING OF SURGERY with CHG soap.  2. If you choose to wash your hair, wash your hair first as usual with your normal shampoo.  3. After shampooing, rinse your hair and body thoroughly to remove the shampoo.  4. Use CHG as you would any other liquid soap. You can apply CHG directly to the skin and wash gently with a scrungie or a clean washcloth.  5. Apply the CHG soap to your body only from the neck down. Do not use on open wounds or open sores. Avoid contact with your eyes, ears, mouth, and genitals (private parts). Wash face and genitals (private parts) with your normal soap.  6. Wash thoroughly, paying special attention to the area where your surgery will be performed.  7. Thoroughly rinse your body with warm  water.  8. Do not shower/wash with your normal soap after using and rinsing off the CHG soap.  9. Pat yourself dry with a clean towel.  10. Wear clean pajamas to bed the night before surgery.  12. Place clean sheets on your bed the night of your first shower and do not sleep with pets.  13. Shower again with the CHG soap on the day of surgery prior to arriving at the hospital.  14. Do not apply any deodorants/lotions/powders.  15. Please wear clean clothes to the hospital.

## 2022-11-21 ENCOUNTER — Ambulatory Visit: Payer: Medicare Other | Attending: Cardiology

## 2022-11-21 DIAGNOSIS — Z01810 Encounter for preprocedural cardiovascular examination: Secondary | ICD-10-CM | POA: Diagnosis not present

## 2022-11-21 NOTE — Progress Notes (Signed)
Virtual Visit via Telephone Note   Because of Joe Pena Captain's co-morbid illnesses, he is at least at moderate risk for complications without adequate follow up.  This format is felt to be most appropriate for this patient at this time.  The patient did not have access to video technology/had technical difficulties with video requiring transitioning to audio format only (telephone).  All issues noted in this document were discussed and addressed.  No physical exam could be performed with this format.  Please refer to the patient's chart for his consent to telehealth for [facility].  Evaluation Performed:  Preoperative cardiovascular risk assessment _____________   Date:  11/21/2022   Patient ID:  Joe Pena, DOB September 21, 1950, MRN [id number] Patient Location:  Home Provider location:   Office  Primary Care Provider:  Lauro Regulus, MD Primary Cardiologist:  None  Chief Complaint / Patient Profile   72 y.o. y/o male with a h/o hypertension, AAA who is pending umbilical hernia repair and presents today for telephonic preoperative cardiovascular risk assessment.  History of Present Illness    Joe Pena is a 72 y.o. male who presents via audio/video conferencing for a telehealth visit today.  Pt was last seen in cardiology clinic on 07/14/2022 by Dr. Ladona Ridgel.  At that time Joe Pena was doing well with no new cardiac complaints..  The patient is now pending procedure as outlined above. Since his last visit, he continues to do well with no cardiac complaints or other health concerns except for his hernia.  He recently had a physical by his PCP and reports that his checkup was going well and blood pressure was well-controlled in the 120s over 80s.  He denies chest pain, shortness of breath, lower extremity edema, fatigue, palpitations, melena, hematuria, hemoptysis, diaphoresis, weakness, presyncope, syncope, orthopnea, and PND.    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   Sleep apnea    Past Surgical History:  Procedure Laterality Date   AAA repair with stent     ACHILLES TENDON SURGERY     COLONOSCOPY     COLONOSCOPY WITH PROPOFOL N/A 08/13/2017   Procedure: COLONOSCOPY WITH PROPOFOL;  Surgeon: Christena Deem, MD;  Location: [facility] ENDOSCOPY;  Service: Endoscopy;  Laterality: N/A;   COLONOSCOPY WITH PROPOFOL N/A 05/26/2022   Procedure: COLONOSCOPY WITH PROPOFOL;  Surgeon: Jaynie Collins, DO;  Location: [facility] ENDOSCOPY;  Service: Gastroenterology;  Laterality: N/A;   ESOPHAGOGASTRODUODENOSCOPY (EGD) WITH PROPOFOL N/A 05/26/2022   Procedure: ESOPHAGOGASTRODUODENOSCOPY (EGD) WITH PROPOFOL;  Surgeon: Jaynie Collins, DO;  [facility];  Service: Gastroenterology;  Laterality: N/A;   EYE SURGERY     HERNIA REPAIR     lens repair     PERIPHERAL VASCULAR CATHETERIZATION N/A 06/07/2016   Procedure: Endovascular Repair/Stent Graft;  Surgeon: Renford Dills, MD;  Location: [facility] INVASIVE CV LAB;  Service: Cardiovascular;  Laterality: N/A;    Allergies  Allergies  Allergen Reactions   Pollen Extract Shortness Of Breath and Itching    Home Medications    Prior to Admission medications   Medication Sig Start Date End Date Taking? Authorizing Provider  amLODipine (NORVASC) 10 MG tablet Take 10 mg by mouth daily.    [provider]  atorvastatin (LIPITOR) 40 MG tablet Take 1 tablet by mouth daily. 08/25/20   [provider]  cetirizine (ZYRTEC) 10 MG tablet Take by mouth.    [provider]  metoprolol succinate (TOPROL-XL) 25 MG 24 hr tablet Take 1 tablet (25 mg total) by mouth daily. 08/21/22   Marinus Maw, MD  Multiple Vitamins-Minerals (CENTRUM SILVER PO) Take 1 tablet  by mouth daily.    [provider]  olmesartan (BENICAR) 40 MG tablet Take 40 mg by mouth daily. 02/21/21   [provider]  pantoprazole (PROTONIX) 40 MG tablet Take 40 mg by mouth daily. 09/09/21   [provider]    Physical Exam    Vital Signs:  Joe Pena does not have vital signs available for review today.  Given telephonic nature of communication, physical exam is limited. AAOx3. NAD. Normal affect.  Speech and respirations are unlabored.  Accessory Clinical Findings    None  Assessment & Plan    1.  Preoperative Cardiovascular Risk Assessment:  Patient's RCRI is 0.9%  The patient affirms he has been doing well without any new cardiac symptoms. They are able to achieve 4 METS without cardiac limitations. Therefore, based on ACC/AHA guidelines, the patient would be at acceptable risk for the planned procedure without further cardiovascular testing. The patient was advised that if he develops new symptoms prior to surgery to contact our office to arrange for a follow-up visit, and he verbalized understanding.   The patient was advised that if he develops new symptoms prior to surgery to contact our office to arrange for a follow-up visit, and he verbalized understanding.  No medications requested to be held  A copy of this note will be routed to requesting surgeon.  Time:   Today, I have spent 6 minutes with the patient with telehealth technology discussing medical history, symptoms, and management plan.     Napoleon Form, Leodis Rains, NP  11/21/2022, 7:59 AM

## 2022-11-22 ENCOUNTER — Encounter: Payer: Self-pay | Admitting: General Surgery

## 2022-11-23 ENCOUNTER — Encounter
Admission: RE | Admit: 2022-11-23 | Discharge: 2022-11-23 | Disposition: A | Discharge status: Home / Self Care | Payer: Medicare Other | Source: Ambulatory Visit | Attending: General Surgery | Admitting: General Surgery

## 2022-11-23 DIAGNOSIS — Z01812 Encounter for preprocedural laboratory examination: Secondary | ICD-10-CM | POA: Insufficient documentation

## 2022-11-23 DIAGNOSIS — I10 Essential (primary) hypertension: Secondary | ICD-10-CM | POA: Diagnosis not present

## 2022-11-23 LAB — BASIC METABOLIC PANEL
Anion gap: 6 (ref 5–15)
BUN: 15 mg/dL (ref 8–23)
CO2: 27 mmol/L (ref 22–32)
Calcium: 9 mg/dL (ref 8.9–10.3)
Chloride: 107 mmol/L (ref 98–111)
Creatinine, Ser: 1.49 mg/dL — ABNORMAL HIGH (ref 0.61–1.24)
GFR, Estimated: 49 mL/min — ABNORMAL LOW (ref >60)
Glucose, Bld: 93 mg/dL (ref 70–99)
Potassium: 4.2 mmol/L (ref 3.5–5.1)
Sodium: 140 mmol/L (ref 135–145)

## 2022-11-23 LAB — CBC
HCT: 44.4 % (ref 39.0–52.0)
Hemoglobin: 14.7 g/dL (ref 13.0–17.0)
MCH: 31.6 pg (ref 26.0–34.0)
MCHC: 33.1 g/dL (ref 30.0–36.0)
MCV: 95.5 fL (ref 80.0–100.0)
Platelets: 214 10*3/uL (ref 150–400)
RBC: 4.65 MIL/uL (ref 4.22–5.81)
RDW: 13.4 % (ref 11.5–15.5)
WBC: 6.1 10*3/uL (ref 4.0–10.5)
nRBC: 0 % (ref 0.0–0.2)

## 2022-11-27 ENCOUNTER — Other Ambulatory Visit: Payer: Self-pay

## 2022-11-27 ENCOUNTER — Ambulatory Visit: Payer: Medicare Other | Admitting: Urgent Care

## 2022-11-27 ENCOUNTER — Encounter
Admission: RE | Disposition: A | Discharge status: Home / Self Care | Payer: Self-pay | Source: Home / Self Care | Attending: General Surgery

## 2022-11-27 ENCOUNTER — Encounter: Payer: Self-pay | Admitting: General Surgery

## 2022-11-27 ENCOUNTER — Ambulatory Visit
Admission: RE | Admit: 2022-11-27 | Discharge: 2022-11-27 | Disposition: A | Discharge status: Home / Self Care | Payer: Medicare Other | Attending: General Surgery | Admitting: General Surgery

## 2022-11-27 DIAGNOSIS — Z95820 Peripheral vascular angioplasty status with implants and grafts: Secondary | ICD-10-CM | POA: Insufficient documentation

## 2022-11-27 DIAGNOSIS — K420 Umbilical hernia with obstruction, without gangrene: Secondary | ICD-10-CM | POA: Insufficient documentation

## 2022-11-27 DIAGNOSIS — I10 Essential (primary) hypertension: Secondary | ICD-10-CM | POA: Insufficient documentation

## 2022-11-27 DIAGNOSIS — I714 Abdominal aortic aneurysm, without rupture, unspecified: Secondary | ICD-10-CM | POA: Insufficient documentation

## 2022-11-27 DIAGNOSIS — G4733 Obstructive sleep apnea (adult) (pediatric): Secondary | ICD-10-CM | POA: Insufficient documentation

## 2022-11-27 HISTORY — DX: Obstructive sleep apnea (adult) (pediatric): G47.33

## 2022-11-27 HISTORY — PX: UMBILICAL HERNIA REPAIR: SHX196

## 2022-11-27 HISTORY — DX: Gastro-esophageal reflux disease without esophagitis: K21.9

## 2022-11-27 HISTORY — PX: INSERTION OF MESH: SHX5868

## 2022-11-27 HISTORY — DX: Umbilical hernia without obstruction or gangrene: K42.9

## 2022-11-27 SURGERY — REPAIR, HERNIA, UMBILICAL, ADULT
Anesthesia: General | Site: Abdomen

## 2022-11-27 MED ORDER — CEFAZOLIN SODIUM-DEXTROSE 2-4 GM/100ML-% IV SOLN
2.0000 g | INTRAVENOUS | Status: AC
  Administered 2022-11-27: 2 g via INTRAVENOUS

## 2022-11-27 MED ORDER — PROPOFOL 10 MG/ML IV BOLUS
INTRAVENOUS | Status: AC
  Filled 2022-11-27: qty 40

## 2022-11-27 MED ORDER — CHLORHEXIDINE GLUCONATE 0.12 % MT SOLN
15.0000 mL | Freq: Once | OROMUCOSAL | Status: AC
  Administered 2022-11-27: 15 mL via OROMUCOSAL

## 2022-11-27 MED ORDER — HYDROCODONE-ACETAMINOPHEN 5-325 MG PO TABS: 1.0000 | ORAL_TABLET | ORAL | 0 refills | Status: AC | PRN

## 2022-11-27 MED ORDER — FENTANYL CITRATE (PF) 100 MCG/2ML IJ SOLN
INTRAMUSCULAR | Status: AC
  Filled 2022-11-27: qty 2

## 2022-11-27 MED ORDER — BUPIVACAINE-EPINEPHRINE 0.5% -1:200000 IJ SOLN
INTRAMUSCULAR | Status: DC | PRN
  Administered 2022-11-27: 30 mL

## 2022-11-27 MED ORDER — MIDAZOLAM HCL 2 MG/2ML IJ SOLN
INTRAMUSCULAR | Status: DC | PRN
  Administered 2022-11-27 (×2): 1 mg via INTRAVENOUS

## 2022-11-27 MED ORDER — ONDANSETRON HCL 4 MG/2ML IJ SOLN: 4.0000 mg | Freq: Once | INTRAMUSCULAR | Status: DC | PRN

## 2022-11-27 MED ORDER — LACTATED RINGERS IV SOLN: INTRAVENOUS | Status: DC

## 2022-11-27 MED ORDER — OXYCODONE HCL 5 MG PO TABS
ORAL_TABLET | ORAL | Status: AC
  Filled 2022-11-27: qty 1

## 2022-11-27 MED ORDER — ONDANSETRON HCL 4 MG/2ML IJ SOLN
INTRAMUSCULAR | Status: AC
  Filled 2022-11-27: qty 2

## 2022-11-27 MED ORDER — 0.9 % SODIUM CHLORIDE (POUR BTL) OPTIME
TOPICAL | Status: DC | PRN
  Administered 2022-11-27: 500 mL

## 2022-11-27 MED ORDER — FENTANYL CITRATE (PF) 100 MCG/2ML IJ SOLN
INTRAMUSCULAR | Status: DC | PRN
  Administered 2022-11-27: 25 ug via INTRAVENOUS
  Administered 2022-11-27: 50 ug via INTRAVENOUS

## 2022-11-27 MED ORDER — OXYCODONE HCL 5 MG/5ML PO SOLN: 5.0000 mg | Freq: Once | ORAL | Status: AC | PRN

## 2022-11-27 MED ORDER — DEXAMETHASONE SODIUM PHOSPHATE 10 MG/ML IJ SOLN
INTRAMUSCULAR | Status: AC
  Filled 2022-11-27: qty 1

## 2022-11-27 MED ORDER — ROCURONIUM BROMIDE 10 MG/ML (PF) SYRINGE
PREFILLED_SYRINGE | INTRAVENOUS | Status: AC
  Filled 2022-11-27: qty 10

## 2022-11-27 MED ORDER — MIDAZOLAM HCL 2 MG/2ML IJ SOLN
INTRAMUSCULAR | Status: AC
  Filled 2022-11-27: qty 2

## 2022-11-27 MED ORDER — LIDOCAINE HCL (PF) 2 % IJ SOLN
INTRAMUSCULAR | Status: AC
  Filled 2022-11-27: qty 5

## 2022-11-27 MED ORDER — CEFAZOLIN SODIUM-DEXTROSE 2-4 GM/100ML-% IV SOLN
INTRAVENOUS | Status: AC
  Filled 2022-11-27: qty 100

## 2022-11-27 MED ORDER — OXYCODONE HCL 5 MG PO TABS
5.0000 mg | ORAL_TABLET | Freq: Once | ORAL | Status: AC | PRN
  Administered 2022-11-27: 5 mg via ORAL

## 2022-11-27 MED ORDER — CHLORHEXIDINE GLUCONATE 0.12 % MT SOLN
OROMUCOSAL | Status: AC
  Filled 2022-11-27: qty 15

## 2022-11-27 MED ORDER — EPHEDRINE SULFATE (PRESSORS) 50 MG/ML IJ SOLN
INTRAMUSCULAR | Status: DC | PRN
  Administered 2022-11-27 (×2): 5 mg via INTRAVENOUS
  Administered 2022-11-27: 10 mg via INTRAVENOUS
  Administered 2022-11-27: 5 mg via INTRAVENOUS

## 2022-11-27 MED ORDER — LIDOCAINE HCL (CARDIAC) PF 100 MG/5ML IV SOSY
PREFILLED_SYRINGE | INTRAVENOUS | Status: DC | PRN
  Administered 2022-11-27: 100 mg via INTRAVENOUS

## 2022-11-27 MED ORDER — ONDANSETRON HCL 4 MG/2ML IJ SOLN
INTRAMUSCULAR | Status: DC | PRN
  Administered 2022-11-27: 4 mg via INTRAVENOUS

## 2022-11-27 MED ORDER — ACETAMINOPHEN 10 MG/ML IV SOLN: 1000.0000 mg | Freq: Once | INTRAVENOUS | Status: DC | PRN

## 2022-11-27 MED ORDER — DEXAMETHASONE SODIUM PHOSPHATE 10 MG/ML IJ SOLN
INTRAMUSCULAR | Status: DC | PRN
  Administered 2022-11-27: 10 mg via INTRAVENOUS

## 2022-11-27 MED ORDER — ORAL CARE MOUTH RINSE: 15.0000 mL | Freq: Once | OROMUCOSAL | Status: AC

## 2022-11-27 MED ORDER — FENTANYL CITRATE (PF) 100 MCG/2ML IJ SOLN
25.0000 ug | INTRAMUSCULAR | Status: DC | PRN
  Administered 2022-11-27 (×3): 25 ug via INTRAVENOUS

## 2022-11-27 MED ORDER — BUPIVACAINE-EPINEPHRINE (PF) 0.5% -1:200000 IJ SOLN
INTRAMUSCULAR | Status: AC
  Filled 2022-11-27: qty 30

## 2022-11-27 SURGICAL SUPPLY — 35 items
ADH SKN CLS APL DERMABOND .7 (GAUZE/BANDAGES/DRESSINGS) ×1
APL PRP STRL LF DISP 70% ISPRP (MISCELLANEOUS) ×1
BLADE SURG 15 STRL LF DISP TIS (BLADE) ×1 IMPLANT
BLADE SURG 15 STRL SS (BLADE) ×1
CHLORAPREP W/TINT 26 (MISCELLANEOUS) ×1 IMPLANT
DERMABOND ADVANCED .7 DNX12 (GAUZE/BANDAGES/DRESSINGS) ×1 IMPLANT
DRAPE LAPAROTOMY 100X77 ABD (DRAPES) ×1 IMPLANT
ELECT REM PT RETURN 9FT ADLT (ELECTROSURGICAL) ×1
ELECTRODE REM PT RTRN 9FT ADLT (ELECTROSURGICAL) ×1 IMPLANT
GAUZE 4X4 16PLY ~~LOC~~+RFID DBL (SPONGE) ×1 IMPLANT
GAUZE SPONGE 4X4 12PLY STRL (GAUZE/BANDAGES/DRESSINGS) IMPLANT
GLOVE BIO SURGEON STRL SZ 6.5 (GLOVE) ×1 IMPLANT
GLOVE BIOGEL PI IND STRL 6.5 (GLOVE) ×1 IMPLANT
GOWN STRL REUS W/ TWL LRG LVL3 (GOWN DISPOSABLE) ×1 IMPLANT
GOWN STRL REUS W/TWL LRG LVL3 (GOWN DISPOSABLE) ×1
KIT TURNOVER KIT A (KITS) ×1 IMPLANT
LABEL OR SOLS (LABEL) ×1 IMPLANT
MANIFOLD NEPTUNE II (INSTRUMENTS) ×1 IMPLANT
MESH VENTRALEX ST 1-7/10 CRC S (Mesh General) IMPLANT
MESH VENTRALEX ST 2.5 CRC MED (Mesh General) IMPLANT
MESH VENTRALEX ST 8CM LRG (Mesh General) IMPLANT
NDL HYPO 25X1 1.5 SAFETY (NEEDLE) ×1 IMPLANT
NEEDLE HYPO 25X1 1.5 SAFETY (NEEDLE) ×1 IMPLANT
NS IRRIG 500ML POUR BTL (IV SOLUTION) ×1 IMPLANT
PACK BASIN MINOR ARMC (MISCELLANEOUS) ×1 IMPLANT
SUT MNCRL 4-0 (SUTURE) ×1
SUT MNCRL 4-0 27XMFL (SUTURE) ×1
SUT PDS PLUS AB 0 CT-2 (SUTURE) ×1 IMPLANT
SUT PROLENE 0 CT 2 (SUTURE) ×2 IMPLANT
SUT VIC AB 2-0 SH 27 (SUTURE) ×2
SUT VIC AB 2-0 SH 27XBRD (SUTURE) ×1 IMPLANT
SUTURE MNCRL 4-0 27XMF (SUTURE) ×1 IMPLANT
SYR 10ML LL (SYRINGE) ×1 IMPLANT
TRAP FLUID SMOKE EVACUATOR (MISCELLANEOUS) ×1 IMPLANT
WATER STERILE IRR 500ML POUR (IV SOLUTION) ×1 IMPLANT

## 2022-11-27 NOTE — Op Note (Signed)
Preoperative diagnosis: Umbilical hernia  Postoperative diagnosis: Umbilical hernia  Procedure:  Open umbilical hernia repair   Anesthesia: LMA  Surgeon: Carolan Shiver, MD  Wound Classification: Clean  Specimen: None  Complications: None  Estimated Blood Loss: Minimal  Indications: A 72 year old male with a symptomatic umbilical hernia.  Repair was indicated to improve discomfort and decrease risk of incarceration and/or strangulation.  Findings: 2 cm x 2 cm incarcerated umbilical hernia 2. Tension free repair achieved with suture 3. Adequate hemostasis  Description of procedure: The patient was brought to the operating room and general anesthesia was induced. A time-out was completed verifying correct patient, procedure, site, positioning, and implant(s) and/or special equipment prior to beginning this procedure. Antibiotics were administered prior to making the incision. SCDs placed. The anterior abdominal wall was prepped and draped in the standard sterile fashion.   An infraumbilical incision was made after infusing the preplanned incision.  Dissection carried down to fascia where the umbilical stalk was noted.  The stalk was transected and there was noted to be a 2 cm x 2 cm umbilical hernia with incarcerated fat.  The preperitoneal fat contents were dissected off the surrounding structures and reduced.  Hemostasis was confirmed prior to reducing the actual contents.    An 8 cm round mesh was inserted in the defect. The mesh was fixed with 0 Prolene circumferentially.   The defect itself was primary closed using 0-PDS in a figure-of-eight fashion.  After confirming hemostasis, the umbilical stalk was reattached to the abdominal wall using 2-0 Vicryl and the wound was irrigated and closed in a multilayer fashion, using 2-0 Vicryl for the deep dermal layer in an interrupted fashion and running 4-0 Monocryl in a subcuticular fashion.  Wound was then dressed with  Dermabond.  Patient was then successfully awakened and transferred to PACU in stable condition.  At the end of the procedure sponge and instrument counts were correct

## 2022-11-27 NOTE — Interval H&P Note (Signed)
History and Physical Interval Note:  11/27/2022 7:00 AM  Joe Pena  has presented today for surgery, with the diagnosis of K42.0 Umbilical hernia, incarcerated.  The various methods of treatment have been discussed with the patient and family. After consideration of risks, benefits and other options for treatment, the patient has consented to  Procedure(s): HERNIA REPAIR UMBILICAL ADULT (N/A) as a surgical intervention.  The patient's history has been reviewed, patient examined, no change in status, stable for surgery.  I have reviewed the patient's chart and labs.  Questions were answered to the patient's satisfaction.     Carolan Shiver

## 2022-11-27 NOTE — Anesthesia Preprocedure Evaluation (Addendum)
Anesthesia Evaluation  Patient identified by MRN, date of birth, ID band Patient awake    Reviewed: Allergy & Precautions, NPO status , Patient's Chart, lab work & pertinent test results  History of Anesthesia Complications Negative for: history of anesthetic complications  Airway Mallampati: I   Neck ROM: Full    Dental no notable dental hx.    Pulmonary sleep apnea and Continuous Positive Airway Pressure Ventilation    Pulmonary exam normal breath sounds clear to auscultation       Cardiovascular hypertension, + Peripheral Vascular Disease (AAA)  Normal cardiovascular exam Rhythm:Regular Rate:Normal  ECG 07/14/22: Sinus bradycardia (HR 59); LAD   Neuro/Psych negative neurological ROS     GI/Hepatic ,GERD  ,,  Endo/Other  Prediabetes   Renal/GU Renal disease (stage III CKD)     Musculoskeletal   Abdominal   Peds  Hematology negative hematology ROS (+)   Anesthesia Other Findings Cardiology note 11/21/22: 1.  Preoperative Cardiovascular Risk Assessment:   Patient's RCRI is 0.9%   The patient affirms he has been doing well without any new cardiac symptoms. They are able to achieve 4 METS without cardiac limitations. Therefore, based on ACC/AHA guidelines, the patient would be at acceptable risk for the planned procedure without further cardiovascular testing. The patient was advised that if he develops new symptoms prior to surgery to contact our office to arrange for a follow-up visit, and he verbalized understanding.    The patient was advised that if he develops new symptoms prior to surgery to contact our office to arrange for a follow-up visit, and he verbalized understanding.   No medications requested to be held   Reproductive/Obstetrics                             Anesthesia Physical Anesthesia Plan  ASA: 2  Anesthesia Plan: General   Post-op Pain Management:    Induction:  Intravenous  PONV Risk Score and Plan: 2 and Ondansetron, Dexamethasone and Treatment may vary due to age or medical condition  Airway Management Planned: LMA  Additional Equipment:   Intra-op Plan:   Post-operative Plan: Extubation in OR  Informed Consent: I have reviewed the patients History and Physical, chart, labs and discussed the procedure including the risks, benefits and alternatives for the proposed anesthesia with the patient or authorized representative who has indicated his/her understanding and acceptance.     Dental advisory given  Plan Discussed with: CRNA  Anesthesia Plan Comments: (Patient consented for risks of anesthesia including but not limited to:  - adverse reactions to medications - damage to eyes, teeth, lips or other oral mucosa - nerve damage due to positioning  - sore throat or hoarseness - damage to heart, brain, nerves, lungs, other parts of body or loss of life  Informed patient about role of CRNA in peri- and intra-operative care.  Patient voiced understanding.)        Anesthesia Quick Evaluation

## 2022-11-27 NOTE — Anesthesia Postprocedure Evaluation (Signed)
Anesthesia Post Note  Patient: Joe Pena  Procedure(s) Performed: HERNIA REPAIR UMBILICAL ADULT (Abdomen) INSERTION OF MESH (Abdomen)  Patient location during evaluation: PACU Anesthesia Type: General Level of consciousness: awake and alert, oriented and patient cooperative Pain management: pain level controlled Vital Signs Assessment: post-procedure vital signs reviewed and stable Respiratory status: spontaneous breathing, nonlabored ventilation and respiratory function stable Cardiovascular status: blood pressure returned to baseline and stable Postop Assessment: adequate PO intake Anesthetic complications: no   No notable events documented.   Last Vitals:  Vitals:   11/27/22 0915 11/27/22 0932  BP: 118/77 126/82  Pulse: 71 69  Resp: 18 18  Temp:  (!) 36 C  SpO2: 95% 94%    Last Pain:  Vitals:   11/27/22 0932  TempSrc: Temporal  PainSc: 4                  Reed Breech

## 2022-11-27 NOTE — Transfer of Care (Signed)
Immediate Anesthesia Transfer of Care Note  Patient: Joe Pena  Procedure(s) Performed: HERNIA REPAIR UMBILICAL ADULT (Abdomen) INSERTION OF MESH (Abdomen)  Patient Location: PACU  Anesthesia Type:General  Level of Consciousness: drowsy and responds to stimulation  Airway & Oxygen Therapy: Patient Spontanous Breathing  Post-op Assessment: Report given to RN, Post -op Vital signs reviewed and stable, and Patient moving all extremities  Post vital signs: Reviewed and stable  Last Vitals:  Vitals Value Taken Time  BP 129/82 11/27/22 0843  Temp    Pulse 76 11/27/22 0843  Resp 12 11/27/22 0843  SpO2 100 % 11/27/22 0843  Vitals shown include unvalidated device data.  Last Pain:  Vitals:   11/27/22 0622  TempSrc: Temporal  PainSc: 0-No pain         Complications: No notable events documented.

## 2022-11-27 NOTE — Discharge Instructions (Signed)

## 2022-11-27 NOTE — Anesthesia Procedure Notes (Signed)
Procedure Name: LMA Insertion Date/Time: 11/27/2022 7:35 AM  Performed by: Danelle Berry, CRNAPre-anesthesia Checklist: Patient identified, Emergency Drugs available, Suction available, Patient being monitored and Timeout performed Patient Re-evaluated:Patient Re-evaluated prior to induction Oxygen Delivery Method: Circle system utilized Preoxygenation: Pre-oxygenation with 100% oxygen Induction Type: IV induction Ventilation: Mask ventilation without difficulty LMA: LMA inserted LMA Size: 4.0 Number of attempts: 1 Tube secured with: Tape

## 2023-03-31 IMAGING — CT CT CHEST W/O CM
2 of 4 series · 14 of 36 positions shown, 17 images · non-contrast
Comparison: Radiographs from 06/06/2016 and overlapping portions of
CT abdomen from 06/06/2016

CLINICAL DATA: History of asbestos exposure. Reported history of
COVID infection 1 month prior to imaging.



[Series 2: thorax · axial · 0.69mm/px · z∈[-576,-268]mm · 11 of 184 slices shown, 14 images]
[im 15/184  mediastinal]
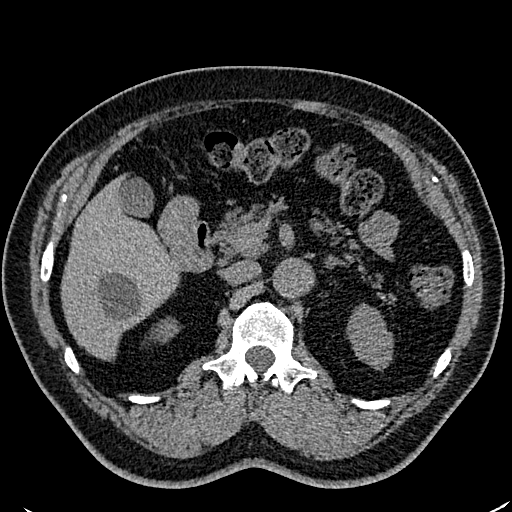
[im 15/184  lung]
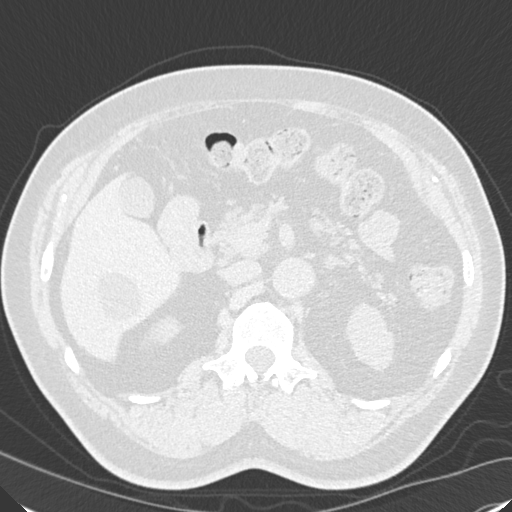
[im 29/184  lung]
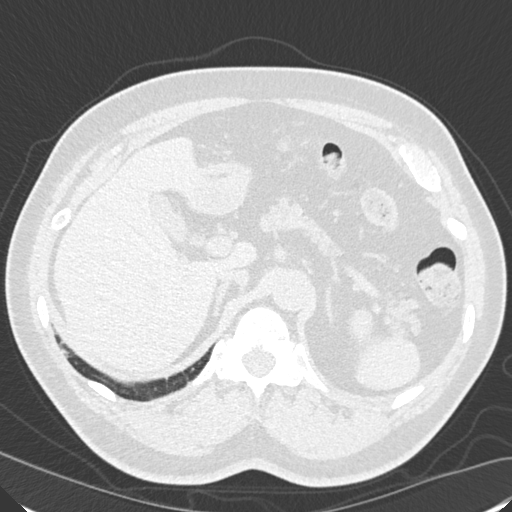
[im 43/184  lung]
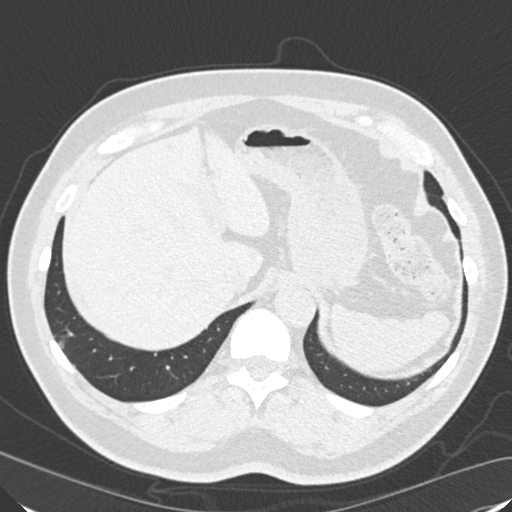
[im 57/184  lung]
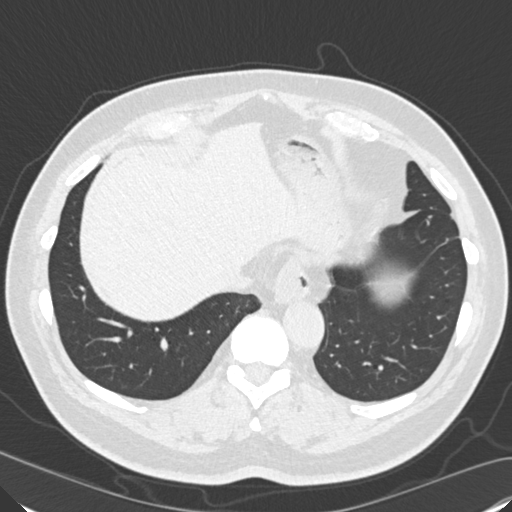
[im 71/184  mediastinal]
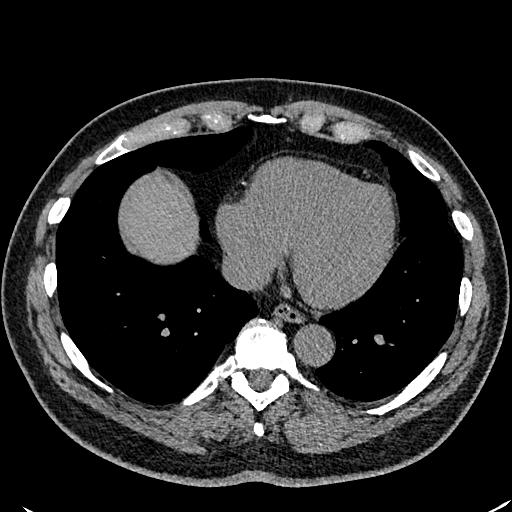
[im 71/184  lung]
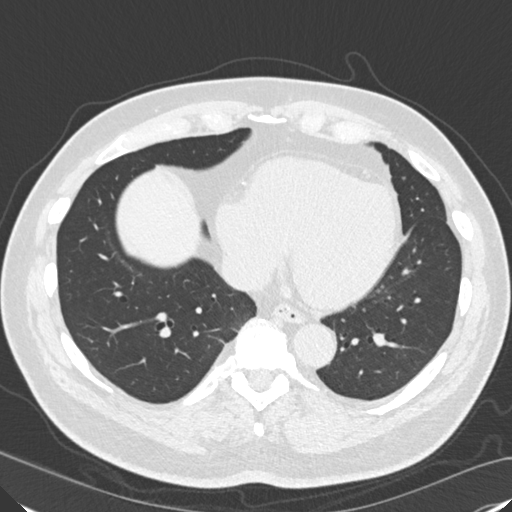
[im 99/184  lung]
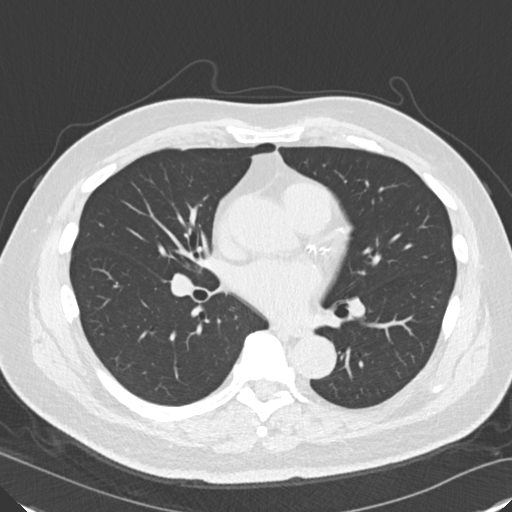
[im 113/184  lung]
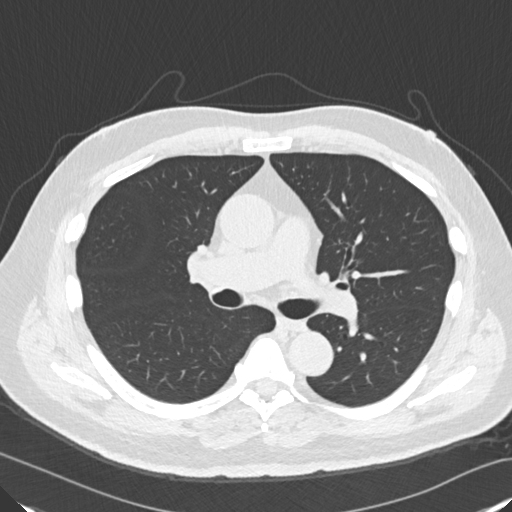
[im 127/184  lung]
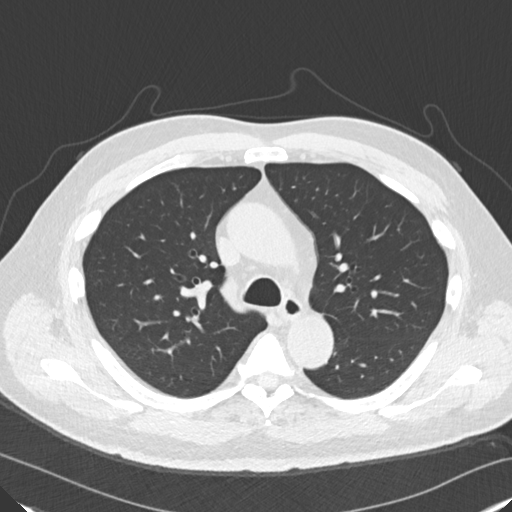
[im 141/184  mediastinal]
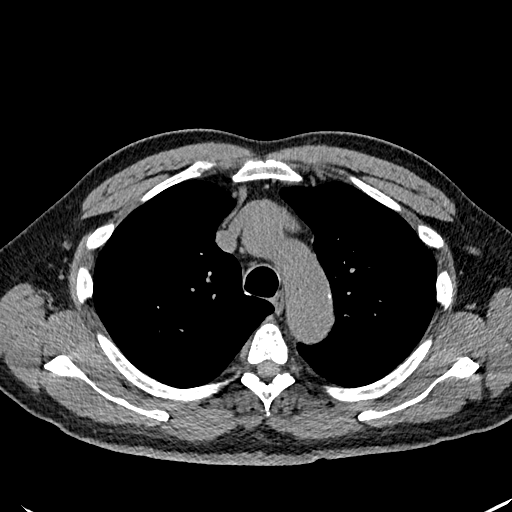
[im 141/184  lung]
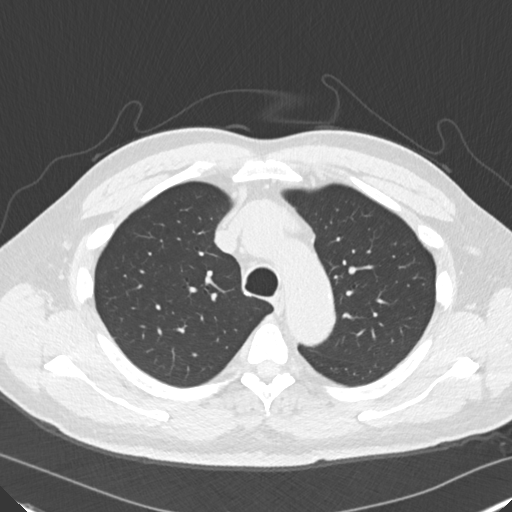
[im 155/184  lung]
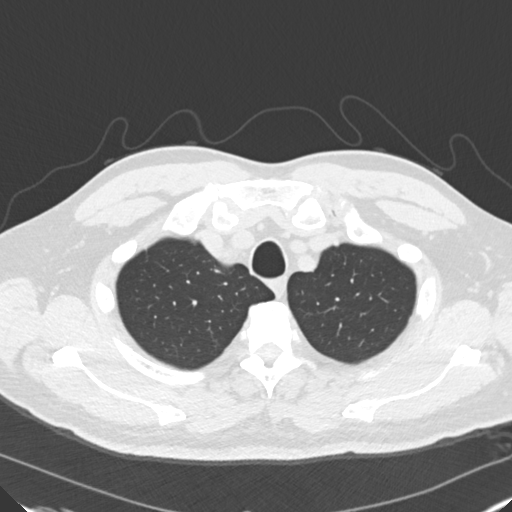
[im 169/184  lung]
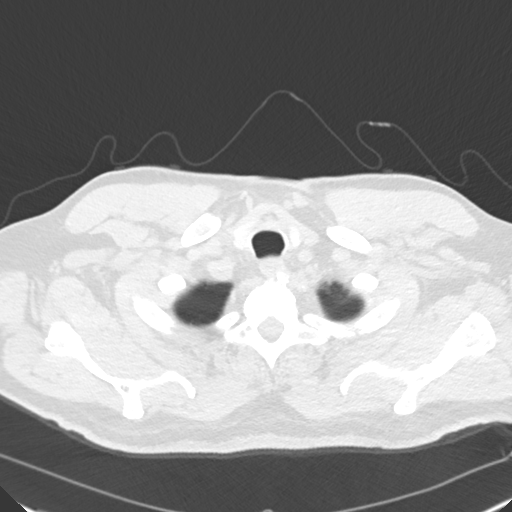

[Series 5: coronal · coronal · 0.69mm/px · 3 of 127 slices shown]
[im 26/127  lung]
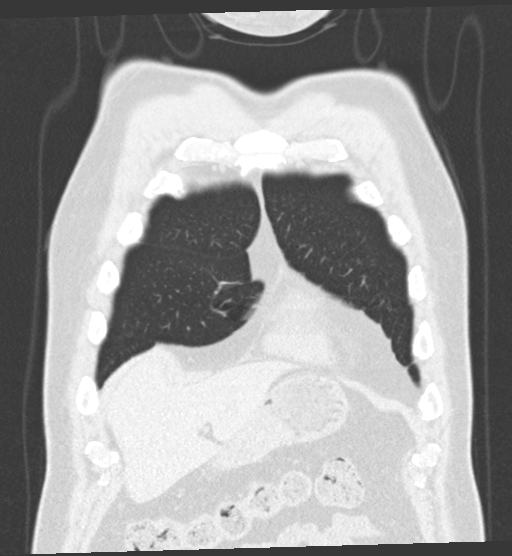
[im 51/127  lung]
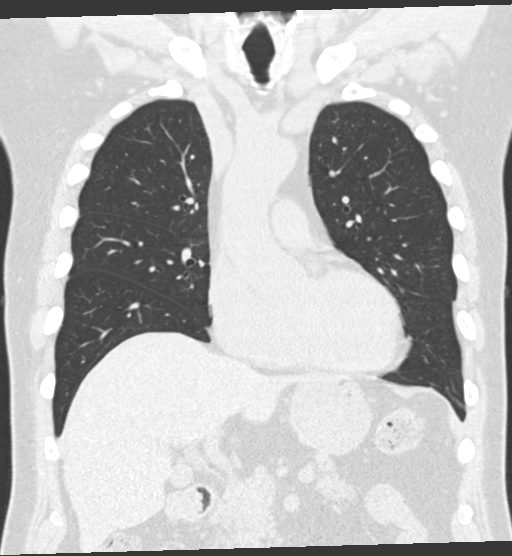
[im 76/127  lung]
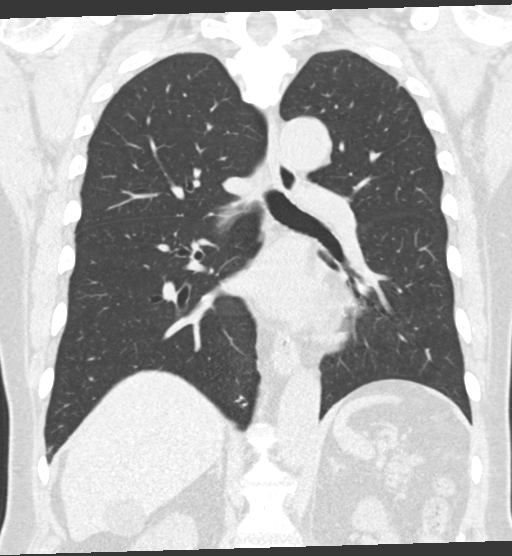

[14 of 36 positions shown; findings below may reference images not displayed]

FINDINGS: Cardiovascular: Atherosclerotic calcification is identified in the
thoracic aorta and in the left anterior descending and right
coronary artery.

Mediastinum/Nodes: No pathologic adenopathy observed. Suspected
small type 1 hiatal hernia.

Lungs/Pleura: Mild linear scarring or subsegmental atelectasis
medially in the right lower lobe. Dependent subsegmental atelectasis
in the right lower lobe. Clustered micro nodularity medially in the
right lower lobe on images 133-137 of series 3, individual nodules
in the 2-3 mm range, likely from mild atypical infectious
bronchiolitis.

Posteriorly in the right upper lobe on image 36 series 3 we
demonstrate a 6 by 4 by 5 mm (volume = 60 mm^3)solid nodule.

Minimal lingular subsegmental atelectasis versus scarring. 2 by 3 mm
pleural-based nodule in the left upper lobe on image 31 series [DATE]
by 3 mm subpleural nodule in the right lower lobe along the major
fissure on image 79 series 3. Bandlike subpleural nodularity in the
right lower lobe along the major fissure measuring about 13 by 4 by
3 mm (volume = 80 mm^3) as shown on images 82-83 of series 3.

No calcified pleural plaques. No peripheral fibrosis to suggest
asbestosis.

Upper Abdomen: Stable 7 by 6 mm hypodense lesion anteriorly segment
4 of the liver on image 143 of series 2, no change from 0349,
compatible with benign lesion such as cyst or hemangioma. Fluid
density lesion posteriorly in the right hepatic lobe on image 173
series 2 likewise similar to prior and probably a cyst. Fluid
density lesion of the right kidney upper pole at site of prior cyst
noted. The top of the aneurysm repair is visible on bottom most
images, with abdominal aortic aneurysm up to 4.1 cm extending above
the level of the repair.

Musculoskeletal: Unremarkable
IMPRESSION: 1. No fibrotic appearance to suggest asbestosis.
2. No substantial calcified pleural plaques.
3. Abdominal aortic aneurysm has extended just above the level of
the aneurysm repair, with aortic caliber at 4.1 cm just above the
level of the right renal artery. Vascular specialist referral
recommended for further assessment.
4. Small pleural base nodule/subpleural lymph nodes, along with a 60
cubic mm right upper lobe nodule averaging 5 mm in diameter. No
follow-up needed if patient is low-risk. Non-contrast chest CT can
be considered in 12 months if patient is high-risk. This
recommendation follows the consensus statement: Guidelines for
Management of Incidental Pulmonary Nodules Detected on CT Images:
5.  Aortic Atherosclerosis ([id number]).  Coronary atherosclerosis.
6. Small type 1 hiatal hernia.

## 2023-04-03 ENCOUNTER — Encounter (INDEPENDENT_AMBULATORY_CARE_PROVIDER_SITE_OTHER): Payer: Self-pay | Admitting: Vascular Surgery

## 2023-04-03 ENCOUNTER — Ambulatory Visit (INDEPENDENT_AMBULATORY_CARE_PROVIDER_SITE_OTHER): Payer: Medicare Other | Admitting: Vascular Surgery

## 2023-04-03 ENCOUNTER — Ambulatory Visit (INDEPENDENT_AMBULATORY_CARE_PROVIDER_SITE_OTHER): Payer: Medicare Other

## 2023-04-03 VITALS — BP 124/81 | HR 60 | Resp 16 | Wt 222.0 lb

## 2023-04-03 DIAGNOSIS — I10 Essential (primary) hypertension: Secondary | ICD-10-CM | POA: Diagnosis not present

## 2023-04-03 DIAGNOSIS — E7800 Pure hypercholesterolemia, unspecified: Secondary | ICD-10-CM

## 2023-04-03 DIAGNOSIS — I7143 Infrarenal abdominal aortic aneurysm, without rupture: Secondary | ICD-10-CM | POA: Diagnosis not present

## 2023-04-03 NOTE — Progress Notes (Signed)
MRN : [id number]  Joe Pena is a 72 y.o. (December 16, 1950) male who presents with chief complaint of  Chief Complaint  Patient presents with   Follow-up    Ultrasound follow up  .  History of Present Illness: Patient returns today in follow up of his aneurysm.  This was repaired some 7 years ago in an urgent fashion for greater than 8 cm aneurysm.  At his last duplex check 6 months ago, he had had an increase in the size of the aortic sac.  No obvious endoleak was seen.  He has had no aneurysm related symptoms. Specifically, the patient denies new back or abdominal pain, or signs of peripheral embolization.  His duplex today shows significant reduction in the size of his aortic sac down to 5.0 cm in maximal diameter.  His stent graft is patent without an endoleak.  Current Outpatient Medications  Medication Sig Dispense Refill   amLODipine (NORVASC) 10 MG tablet Take 10 mg by mouth daily.     atorvastatin (LIPITOR) 40 MG tablet Take 1 tablet by mouth daily.     cetirizine (ZYRTEC) 10 MG tablet Take by mouth.     metoprolol succinate (TOPROL-XL) 25 MG 24 hr tablet Take 1 tablet (25 mg total) by mouth daily. 90 tablet 3   Multiple Vitamins-Minerals (CENTRUM SILVER PO) Take 1 tablet by mouth daily.     olmesartan (BENICAR) 40 MG tablet Take 40 mg by mouth daily.     pantoprazole (PROTONIX) 40 MG tablet Take 40 mg by mouth daily.     No current facility-administered medications for this visit.    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   Umbilical hernia     Past Surgical History:  Procedure Laterality Date   ACHILLES TENDON SURGERY     COLONOSCOPY     COLONOSCOPY WITH PROPOFOL N/A 08/13/2017   Procedure: COLONOSCOPY WITH PROPOFOL;  Surgeon: Christena Deem, MD;  Location: [facility] ENDOSCOPY;  Service: Endoscopy;  Laterality: N/A;   COLONOSCOPY WITH PROPOFOL N/A 05/26/2022   Procedure: COLONOSCOPY WITH PROPOFOL;  Surgeon: Jaynie Collins, DO;  Location: [facility] ENDOSCOPY;  Service: Gastroenterology;  Laterality: N/A;   ENDOVASCULAR REPAIR/STENT GRAFT N/A 06/07/2016   Procedure: ENDOVASCULAR REPAIR/STENT GRAFT; Location: [facility]; Surgeon(s): Festus Barren, MD and Levora Dredge, MD   ESOPHAGOGASTRODUODENOSCOPY (EGD) WITH PROPOFOL N/A 05/26/2022   Procedure: ESOPHAGOGASTRODUODENOSCOPY (EGD) WITH PROPOFOL;  Surgeon: Jaynie Collins, DO;  Location: [facility] ENDOSCOPY;  Service: Gastroenterology;  Laterality: N/A;   EYE SURGERY     HERNIA REPAIR     INSERTION OF MESH N/A 11/27/2022   Procedure: INSERTION OF MESH;  Surgeon: Carolan Shiver, MD;  Location: [facility] ORS;  Service: General;  Laterality: N/A;  UMBILICAL   lens repair     PERIPHERAL VASCULAR CATHETERIZATION N/A 06/07/2016   Procedure: Endovascular Repair/Stent Graft;  Surgeon: Renford Dills, MD;  Location: [facility] INVASIVE CV LAB;  Service:  Cardiovascular;  Laterality: N/A;   UMBILICAL HERNIA REPAIR N/A 11/27/2022   Procedure: HERNIA REPAIR UMBILICAL ADULT;  Surgeon: Carolan Shiver, MD;  Location: [facility] ORS;  Service: General;  Laterality: N/A;     Social History   Tobacco Use   Smoking status: Never   Smokeless tobacco: Never  Vaping Use   Vaping status: Never Used  Substance Use Topics   Alcohol use: Yes    Comment: 2 times weekly   Drug use: No       Family History  Problem Relation Age of Onset   Heart disease Mother    Aortic aneurysm Mother    Hyperlipidemia Father    Prostate cancer Neg Hx    Bladder Cancer Neg  Hx    Kidney cancer Neg Hx      Allergies  Allergen Reactions   Pollen Extract Shortness Of Breath and Itching     REVIEW OF SYSTEMS (Negative unless checked)  Constitutional: [] Weight loss  [] Fever  [] Chills Cardiac: [] Chest pain   [] Chest pressure   [] Palpitations   [] Shortness of breath when laying flat   [] Shortness of breath at rest   [] Shortness of breath with exertion. Vascular:  [] Pain in legs with walking   [] Pain in legs at rest   [] Pain in legs when laying flat   [] Claudication   [] Pain in feet when walking  [] Pain in feet at rest  [] Pain in feet when laying flat   [] History of DVT   [] Phlebitis   [] Swelling in legs   [] Varicose veins   [] Non-healing ulcers Pulmonary:   [] Uses home oxygen   [] Productive cough   [] Hemoptysis   [] Wheeze  [] COPD   [] Asthma Neurologic:  [] Dizziness  [] Blackouts   [] Seizures   [] History of stroke   [] History of TIA  [] Aphasia   [] Temporary blindness   [] Dysphagia   [] Weakness or numbness in arms   [] Weakness or numbness in legs Musculoskeletal:  [x] Arthritis   [] Joint swelling   [] Joint pain   [] Low back pain Hematologic:  [] Easy bruising  [] Easy bleeding   [] Hypercoagulable state   [] Anemic   Gastrointestinal:  [] Blood in stool   [] Vomiting blood  [] Gastroesophageal reflux/heartburn   [x] Abdominal pain Genitourinary:  [] Chronic kidney disease   [] Difficult urination  [] Frequent urination  [] Burning with urination   [] Hematuria Skin:  [] Rashes   [] Ulcers   [] Wounds Psychological:  [] History of anxiety   []  History of major depression.  Physical Examination  BP 124/81 (BP Location: Right Arm)   Pulse 60   Resp 16   Wt 222 lb (100.7 kg)   BMI 27.02 kg/m  Gen:  WD/WN, NAD Head: [place]/AT, No temporalis wasting. Ear/Nose/Throat: Hearing grossly intact, nares w/o erythema or drainage Eyes: Conjunctiva clear. Sclera non-icteric. Visual acuity markedly decreased. Neck: Supple.  Trachea midline Pulmonary:  Good air movement, no use of accessory  muscles.  Cardiac: RRR, no JVD Vascular:  Vessel Right Left  Radial Palpable Palpable                                   Gastrointestinal: soft, non-tender/non-distended. No guarding/reflex.  Musculoskeletal: M/S 5/5 throughout.  No deformity or atrophy. No edema. Neurologic: Sensation grossly intact in extremities.  Symmetrical.  Speech is fluent.  Psychiatric: Judgment intact, Mood & affect appropriate for pt's clinical situation. Dermatologic: No rashes or ulcers noted.  No cellulitis or open wounds.      Labs  No results found for this or any previous visit (from the past 2160 hour(s)).  Radiology No results found.  Assessment/Plan  AAA (abdominal aortic aneurysm) without rupture (HCC) His duplex today shows significant reduction in the size of his aortic sac down to 5.0 cm in maximal diameter.  His stent graft is patent without an endoleak.  This is encouraging.  He is doing well.  We will go back to an annual follow-up at this point.  Essential hypertension blood pressure control important in reducing the progression of atherosclerotic disease and aneurysmal growth. On appropriate oral medications.  Pure hypercholesterolemia lipid control important in reducing the progression of atherosclerotic disease. Continue statin therapy  Festus Barren, MD  04/03/2023 12:57 PM    This note was created with Dragon medical transcription system.  Any errors from dictation are purely unintentional

## 2023-04-03 NOTE — Assessment & Plan Note (Signed)
His duplex today shows significant reduction in the size of his aortic sac down to 5.0 cm in maximal diameter.  His stent graft is patent without an endoleak.  This is encouraging.  He is doing well.  We will go back to an annual follow-up at this point.

## 2023-04-03 NOTE — Assessment & Plan Note (Addendum)
blood pressure control important in reducing the progression of atherosclerotic disease and aneurysmal growth. On appropriate oral medications.  

## 2023-06-05 ENCOUNTER — Other Ambulatory Visit: Payer: Self-pay | Admitting: Internal Medicine

## 2023-07-20 ENCOUNTER — Ambulatory Visit (INDEPENDENT_AMBULATORY_CARE_PROVIDER_SITE_OTHER): Payer: Medicare Other | Admitting: Urology

## 2023-07-20 VITALS — BP 135/87 | HR 77 | Ht 76.0 in | Wt 214.0 lb

## 2023-07-20 DIAGNOSIS — R972 Elevated prostate specific antigen [PSA]: Secondary | ICD-10-CM

## 2023-07-21 NOTE — Progress Notes (Signed)
I,Amy L Pierron,acting as a scribe for Vanna Scotland, MD.,have documented all relevant documentation on the behalf of Vanna Scotland, MD,as directed by  Vanna Scotland, MD while in the presence of Vanna Scotland, MD.  07/20/2023 9:32 AM   Joe Pena [birth year] [id number]  Referring provider: Lauro Regulus, MD [address] [facility] [address] [place],  [place] [address]  Chief Complaint  Patient presents with   Establish Care   Elevated PSA    HPI: 73 year-old male who presents today to establish care for elevated PSA.  Had a PSA checked on 07/05/2023 which was elevated to 6.2. PSA prior to this was checked in 2023 noted to be 3.93, 2.0 in 2018, and 3.1 in 2017. Presumably, doesn't have any urinary issues. He's not on any BPH medications.  No family history, prostate cancer or breast cancer.  He has not had a recent urinalysis. Past medical history is significant for history of AAA status post EVAR, angina, sleep apnea, and retinitis pigmentosa.  He denies having any urinary issues. He has nocturia x2 sometimes.   He is not on any blood thinners.  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   Umbilical hernia     Surgical History: Past Surgical History:  Procedure Laterality Date   ACHILLES TENDON SURGERY     COLONOSCOPY     COLONOSCOPY WITH PROPOFOL N/A 08/13/2017    Procedure: COLONOSCOPY WITH PROPOFOL;  Surgeon: Christena Deem, MD;  Location: [facility] ENDOSCOPY;  Service: Endoscopy;  Laterality: N/A;   COLONOSCOPY WITH PROPOFOL N/A 05/26/2022   Procedure: COLONOSCOPY WITH PROPOFOL;  Surgeon: Jaynie Collins, DO;  Location: [facility] ENDOSCOPY;  Service: Gastroenterology;  Laterality: N/A;   ENDOVASCULAR REPAIR/STENT GRAFT N/A 06/07/2016   Procedure: ENDOVASCULAR REPAIR/STENT GRAFT; Location: [facility]; Surgeon(s): Festus Barren, MD and Levora Dredge, MD   ESOPHAGOGASTRODUODENOSCOPY (EGD) WITH PROPOFOL N/A 05/26/2022   Procedure: ESOPHAGOGASTRODUODENOSCOPY (EGD) WITH PROPOFOL;  Surgeon: Jaynie Collins, DO;  Location: [facility] ENDOSCOPY;  Service: Gastroenterology;  Laterality: N/A;   EYE SURGERY     HERNIA REPAIR     INSERTION OF MESH N/A 11/27/2022   Procedure: INSERTION OF MESH;  Surgeon: Carolan Shiver, MD;  Location: [facility] ORS;  Service: General;  Laterality: N/A;  UMBILICAL   lens repair     PERIPHERAL VASCULAR CATHETERIZATION N/A 06/07/2016   Procedure: Endovascular Repair/Stent Graft;  Surgeon: Renford Dills, MD;  Location: [facility] INVASIVE CV LAB;  Service: Cardiovascular;  Laterality: N/A;   UMBILICAL HERNIA REPAIR N/A 11/27/2022   Procedure: HERNIA REPAIR UMBILICAL ADULT;  Surgeon: Carolan Shiver, MD;  Location: [facility] ORS;  Service: General;  Laterality: N/A;    Home Medications:  Allergies as of 07/20/2023  Reactions   Pollen Extract Shortness Of Breath, Itching        Medication List        Accurate as of July 20, 2023 11:59 PM. If you have any questions, ask your nurse or doctor.          amLODipine 10 MG tablet Commonly known as: NORVASC Take 10 mg by mouth daily.   atorvastatin 40 MG tablet Commonly known as: LIPITOR Take 1 tablet by mouth daily.   CENTRUM SILVER PO Take 1 tablet by mouth daily.   cetirizine 10 MG tablet Commonly known as: ZYRTEC Take by mouth.   metoprolol succinate 25 MG 24 hr  tablet Commonly known as: TOPROL-XL TAKE 1 TABLET (25 MG TOTAL) BY MOUTH DAILY.   olmesartan 40 MG tablet Commonly known as: BENICAR Take 40 mg by mouth daily.   pantoprazole 40 MG tablet Commonly known as: PROTONIX Take 40 mg by mouth daily.        Allergies:  Allergies  Allergen Reactions   Pollen Extract Shortness Of Breath and Itching    Family History: Family History  Problem Relation Age of Onset   Heart disease Mother    Aortic aneurysm Mother    Hyperlipidemia Father    Prostate cancer Neg Hx    Bladder Cancer Neg Hx    Kidney cancer Neg Hx     Social History:  reports that he has never smoked. He has never used smokeless tobacco. He reports current alcohol use. He reports that he does not use drugs.   Physical Exam: BP 135/87   Pulse 77   Ht 6\' 4"  (1.93 m)   Wt 214 lb (97.1 kg)   BMI 26.05 kg/m   Constitutional:  Alert and oriented, No acute distress. HEENT: [place] AT, moist mucus membranes.  Trachea midline, no masses. GU: Normal prostate. Mildly enlarged. Rubbery, no nodules.  Neurologic: Grossly intact, no focal deficits, moving all 4 extremities. Psychiatric: Normal mood and affect.   Assessment & Plan:    1. Elevated PSA  - Rectal exam performed. Plan to repeat his PSA in one month. Will call him with the results. Urine culture sent to rule out infection.   -  We reviewed the implications of an elevated PSA and the uncertainty surrounding it. In general, a man's PSA increases with age and is produced by both normal and cancerous prostate tissue. Differential for elevated PSA is BPH, prostate cancer, infection, recent intercourse/ejaculation, prostate infarction, recent urethroscopic manipulation (foley placement/cystoscopy) and prostatitis. Management of an elevated PSA can include observation or prostate biopsy and wediscussed this in detail.  - We discussed that indications for prostate biopsy are defined by age and race specific PSA cutoffs as well  as a PSA velocity of 0.75/year. We discussed prostate biopsy in detail including the procedure itself, the risks of blood in the urine, stool, and ejaculate, serious infection, and discomfort. He is willing to proceed with this as discussed.  - If the PSA next month is still elevated leaning towards having an MRI first.  Return in about 1 month (around 08/20/2023) for PSA.  I have reviewed the above documentation for accuracy and completeness, and I agree with the above.   Vanna Scotland, MD   [facility] [address], [address] [place], [place] [address] [phone]

## 2023-08-17 ENCOUNTER — Other Ambulatory Visit: Payer: Medicare Other

## 2023-08-17 DIAGNOSIS — R972 Elevated prostate specific antigen [PSA]: Secondary | ICD-10-CM

## 2023-08-17 LAB — URINALYSIS, COMPLETE
Bilirubin, UA: NEGATIVE
Glucose, UA: NEGATIVE
Leukocytes,UA: NEGATIVE
Nitrite, UA: NEGATIVE
Protein,UA: NEGATIVE
RBC, UA: NEGATIVE
Specific Gravity, UA: 1.025 (ref 1.005–1.030)
Urobilinogen, Ur: 0.2 mg/dL (ref 0.2–1.0)
pH, UA: 5.5 (ref 5.0–7.5)

## 2023-08-17 LAB — MICROSCOPIC EXAMINATION: Bacteria, UA: NONE SEEN

## 2023-08-18 LAB — PSA: Prostate Specific Ag, Serum: 5 ng/mL — ABNORMAL HIGH (ref 0.0–4.0)

## 2023-08-22 ENCOUNTER — Ambulatory Visit: Payer: Medicare Other | Admitting: Urology

## 2023-08-22 ENCOUNTER — Telehealth: Payer: Self-pay | Admitting: Internal Medicine

## 2023-08-22 NOTE — Telephone Encounter (Signed)
 Spoke with pt who reports symptoms as below yesterday while he was outside washing his car.  Pt is concerned because he has never had symptoms of stiffness or pain in his neck or shoulder during one of his pre-syncopal/syncopal episodes.  Pt denies, CP or SOB during that time.  BP and HR was 78/56 with HR - 67.  Pt had some water and a salty snack.  Symptoms resolved after about an hour.   Pt reports today he is feeling back to normal.  BP 128/84 with HR 57.  He has not eaten today or taken any medication.  Pt reports he has not been hydrating as well as he should recently.  He does not wear compression or any salt supplementation.    Pt advised to increase hydration and continue medications as prescribed.  Will forward to Dr Ladona Ridgel for review as he is not in the office today.  Offered pt a follow up appointment as his last appointment was 1/24 with prn follow up.  Pt declines and states he will await Dr Lubertha Basque thoughts.  Reviewed ED precautions.  Pt verbalizes understanding and agrees with current plan.

## 2023-08-22 NOTE — Telephone Encounter (Signed)
 Pt c/o BP issue: STAT if pt c/o blurred vision, one-sided weakness or slurred speech  1. What are your last 5 BP readings?  Yesterday... 4:15 PM 78/56 HR 67 9:30 PM 113/70 HR 63 10:15 PM 128/84 HR 57 11:43 PM 115/70 HR 62 9:13 PM 121/79 HR 54 2. Are you having any other symptoms (ex. Dizziness, headache, blurred vision, passed out)? Felt very faint (did not pass out), extremely exhausted, sweaty, and stiffness/pain in neck and shoulder.  3. What is your BP issue? Patient stated yesterday when he was outside cleaning his car, he started to feel the symptoms listed above. Patient stated he has a history of having seizures and that this is how he will feel if he has one. Patient stated the stiffness/pain in neck and shoulder was not a symptom he has had before. Patient stated they gave him a cold towel to place around his neck and head. Patient stated after an hour his symptoms start to relieve. Please advise.

## 2023-08-27 ENCOUNTER — Encounter: Payer: Self-pay | Admitting: Urology

## 2023-08-28 NOTE — Telephone Encounter (Signed)
 Patient wife informed.  Appointment scheduled.

## 2023-08-29 NOTE — Telephone Encounter (Signed)
 Stay hydrated. Keep a check on the blood pressure. If it runs below 110 on a regular basis, stop the amlodipine. I'd rather it be a little high than low.

## 2023-08-29 NOTE — Telephone Encounter (Signed)
 Spoke with pt and advised of Dr Lubertha Basque recommendations as below.  Pt verbalizes understanding and thanked Charity fundraiser for the call.

## 2023-09-17 ENCOUNTER — Other Ambulatory Visit: Payer: Self-pay | Admitting: Internal Medicine

## 2023-11-13 ENCOUNTER — Encounter (INDEPENDENT_AMBULATORY_CARE_PROVIDER_SITE_OTHER): Payer: Self-pay

## 2023-11-15 ENCOUNTER — Other Ambulatory Visit: Payer: Self-pay

## 2023-11-15 ENCOUNTER — Encounter: Payer: Self-pay | Admitting: Gastroenterology

## 2023-11-15 ENCOUNTER — Ambulatory Visit: Admitting: Anesthesiology

## 2023-11-15 ENCOUNTER — Ambulatory Visit
Admission: RE | Admit: 2023-11-15 | Discharge: 2023-11-15 | Disposition: A | Discharge status: Home / Self Care | Attending: Gastroenterology | Admitting: Gastroenterology

## 2023-11-15 ENCOUNTER — Encounter
Admission: RE | Disposition: A | Discharge status: Home / Self Care | Payer: Self-pay | Source: Home / Self Care | Attending: Gastroenterology

## 2023-11-15 DIAGNOSIS — K2980 Duodenitis without bleeding: Secondary | ICD-10-CM | POA: Insufficient documentation

## 2023-11-15 DIAGNOSIS — K449 Diaphragmatic hernia without obstruction or gangrene: Secondary | ICD-10-CM | POA: Insufficient documentation

## 2023-11-15 DIAGNOSIS — K31A0 Gastric intestinal metaplasia, unspecified: Secondary | ICD-10-CM | POA: Diagnosis present

## 2023-11-15 DIAGNOSIS — H3552 Pigmentary retinal dystrophy: Secondary | ICD-10-CM | POA: Insufficient documentation

## 2023-11-15 DIAGNOSIS — G4730 Sleep apnea, unspecified: Secondary | ICD-10-CM | POA: Insufficient documentation

## 2023-11-15 DIAGNOSIS — I739 Peripheral vascular disease, unspecified: Secondary | ICD-10-CM | POA: Insufficient documentation

## 2023-11-15 DIAGNOSIS — I129 Hypertensive chronic kidney disease with stage 1 through stage 4 chronic kidney disease, or unspecified chronic kidney disease: Secondary | ICD-10-CM | POA: Diagnosis not present

## 2023-11-15 DIAGNOSIS — K3189 Other diseases of stomach and duodenum: Secondary | ICD-10-CM | POA: Diagnosis not present

## 2023-11-15 DIAGNOSIS — N183 Chronic kidney disease, stage 3 unspecified: Secondary | ICD-10-CM | POA: Insufficient documentation

## 2023-11-15 HISTORY — PX: ESOPHAGOGASTRODUODENOSCOPY: SHX5428

## 2023-11-15 SURGERY — EGD (ESOPHAGOGASTRODUODENOSCOPY)
Anesthesia: General

## 2023-11-15 MED ORDER — LIDOCAINE HCL URETHRAL/MUCOSAL 2 % EX GEL
CUTANEOUS | Status: DC | PRN
  Administered 2023-11-15: 1 via TOPICAL

## 2023-11-15 MED ORDER — PHENYLEPHRINE 80 MCG/ML (10ML) SYRINGE FOR IV PUSH (FOR BLOOD PRESSURE SUPPORT)
PREFILLED_SYRINGE | INTRAVENOUS | Status: DC | PRN
  Administered 2023-11-15: 160 ug via INTRAVENOUS

## 2023-11-15 MED ORDER — PROPOFOL 500 MG/50ML IV EMUL
INTRAVENOUS | Status: DC | PRN
  Administered 2023-11-15: 50 mg via INTRAVENOUS
  Administered 2023-11-15: 150 ug/kg/min via INTRAVENOUS

## 2023-11-15 MED ORDER — LIDOCAINE HCL (CARDIAC) PF 100 MG/5ML IV SOSY
PREFILLED_SYRINGE | INTRAVENOUS | Status: DC | PRN
  Administered 2023-11-15: 200 mg via INTRAVENOUS

## 2023-11-15 MED ORDER — SODIUM CHLORIDE 0.9 % IV SOLN: INTRAVENOUS | Status: DC

## 2023-11-15 NOTE — Interval H&P Note (Signed)
 History and Physical Interval Note: Preprocedure H&P from 11/15/23  was reviewed and there was no interval change after seeing and examining the patient.  Written consent was obtained from the patient after discussion of risks, benefits, and alternatives. Patient has consented to proceed with Esophagogastroduodenoscopy with possible intervention   11/15/2023 2:01 PM  Joe Pena  has presented today for surgery, with the diagnosis of GERD.  The various methods of treatment have been discussed with the patient and family. After consideration of risks, benefits and other options for treatment, the patient has consented to  Procedure(s): EGD (ESOPHAGOGASTRODUODENOSCOPY) (N/A) as a surgical intervention.  The patient's history has been reviewed, patient examined, no change in status, stable for surgery.  I have reviewed the patient's chart and labs.  Questions were answered to the patient's satisfaction.     Quintin Buckle

## 2023-11-15 NOTE — Anesthesia Postprocedure Evaluation (Signed)
 Anesthesia Post Note  Patient: Joe Pena  Procedure(s) Performed: EGD (ESOPHAGOGASTRODUODENOSCOPY)  Patient location during evaluation: PACU Anesthesia Type: General Level of consciousness: awake and alert, oriented and patient cooperative Pain management: pain level controlled Vital Signs Assessment: post-procedure vital signs reviewed and stable Respiratory status: spontaneous breathing, nonlabored ventilation and respiratory function stable Cardiovascular status: blood pressure returned to baseline and stable Postop Assessment: adequate PO intake Anesthetic complications: no   There were no known notable events for this encounter.   Last Vitals:  Vitals:   11/15/23 1418 11/15/23 1445  BP: (!) 96/53 120/70  Pulse:    Resp:    Temp: (!) 35.6 C   SpO2:      Last Pain:  Vitals:   11/15/23 1418  TempSrc: Temporal                 Vicktoria Muckey

## 2023-11-15 NOTE — Transfer of Care (Signed)
 Immediate Anesthesia Transfer of Care Note  Patient: Joe Pena  Procedure(s) Performed: EGD (ESOPHAGOGASTRODUODENOSCOPY)  Patient Location: PACU  Anesthesia Type:General  Level of Consciousness: drowsy and patient cooperative  Airway & Oxygen Therapy: Patient Spontanous Breathing and Patient connected to nasal cannula oxygen  Post-op Assessment: Report given to RN and Post -op Vital signs reviewed and stable  Post vital signs: stable on 2L oxygen  Last Vitals:  Vitals Value Taken Time  BP 95/63 11/15/23 1420  Temp 35.6 C 11/15/23 1418  Pulse 63 11/15/23 1421  Resp 24 11/15/23 1421  SpO2 97 % 11/15/23 1421  Vitals shown include unfiled device data.  Last Pain:  Vitals:   11/15/23 1418  TempSrc: Temporal         Complications: No notable events documented.

## 2023-11-15 NOTE — H&P (Signed)
 Pre-Procedure H&P   Patient ID: Joe Pena is a 73 y.o. male.  Gastroenterology Provider: Quintin Buckle, DO  Referring Provider: Corrinne Din, NP PCP: Jimmy Moulding, MD  Date: 11/15/2023  HPI [name] is a 73 y.o. male who presents today for Esophagogastroduodenoscopy for gastric intestinal metaplasia surveillance .  Gastritis and duodenitis present. Bx negative for celiac disease and h pylori. Spastic esophagus. 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   Umbilical hernia     Past Surgical History:  Procedure Laterality Date   ACHILLES TENDON SURGERY     COLONOSCOPY     COLONOSCOPY WITH PROPOFOL  N/A 08/13/2017   Procedure: COLONOSCOPY WITH PROPOFOL ;  Surgeon: Deveron Fly, MD;  Location: [facility] ENDOSCOPY;  Service: Endoscopy;  Laterality: N/A;   COLONOSCOPY WITH PROPOFOL  N/A 05/26/2022   Procedure: COLONOSCOPY WITH PROPOFOL ;  Surgeon: Quintin Buckle, DO;  Location: [facility] ENDOSCOPY;  Service: Gastroenterology;  Laterality: N/A;   ENDOVASCULAR REPAIR/STENT GRAFT N/A 06/07/2016   Procedure: ENDOVASCULAR REPAIR/STENT GRAFT; Location: [facility]; Surgeon(s): Mikki Alexander, MD and Devon Fogo, MD   ESOPHAGOGASTRODUODENOSCOPY (EGD) WITH PROPOFOL  N/A 05/26/2022   Procedure: ESOPHAGOGASTRODUODENOSCOPY (EGD) WITH PROPOFOL ;  Surgeon: Quintin Buckle, DO;  Location: [facility] ENDOSCOPY;  Service: Gastroenterology;   Laterality: N/A;   EYE SURGERY     HERNIA REPAIR     INSERTION OF MESH N/A 11/27/2022   Procedure: INSERTION OF MESH;  Surgeon: Eldred Grego, MD;  Location: [facility] ORS;  Service: General;  Laterality: N/A;  UMBILICAL   lens repair     PERIPHERAL VASCULAR CATHETERIZATION N/A 06/07/2016   Procedure: Endovascular Repair/Stent Graft;  Surgeon: Jackquelyn Mass, MD;  Location: [facility] INVASIVE CV LAB;  Service: Cardiovascular;  Laterality: N/A;   UMBILICAL HERNIA REPAIR N/A 11/27/2022   Procedure: HERNIA REPAIR UMBILICAL ADULT;  Surgeon: Eldred Grego, MD;  Location: [facility] ORS;  Service: General;  Laterality: N/A;    Family History No h/o GI disease or malignancy  Review of Systems  Constitutional:  Negative for activity change, appetite change, chills, diaphoresis, fatigue, fever and unexpected weight change.  HENT:  Negative for trouble swallowing and voice change.   Respiratory:  Negative for shortness of breath and wheezing.   Cardiovascular:  Negative for chest pain, palpitations and leg swelling.  Gastrointestinal:  Negative for abdominal distention, abdominal pain, anal bleeding, blood in stool, constipation, diarrhea, nausea and vomiting.  Musculoskeletal:  Negative for arthralgias and myalgias.  Skin:  Negative for color change and pallor.  Neurological:  Negative for dizziness, syncope and weakness.  Psychiatric/Behavioral:  Negative for confusion. The patient is not nervous/anxious.   All other systems reviewed and are negative.    Medications No current facility-administered medications on file prior to encounter.   Current Outpatient Medications on File Prior to Encounter  Medication Sig Dispense Refill  amLODipine  (NORVASC ) 10 MG tablet Take 10 mg by mouth daily.     atorvastatin  (LIPITOR) 40 MG tablet Take 1 tablet by mouth daily.     cetirizine (ZYRTEC) 10 MG tablet Take by mouth.     metoprolol  succinate (TOPROL -XL) 25 MG 24 hr tablet TAKE 1 TABLET (25 MG  TOTAL) BY MOUTH DAILY. 15 tablet 0   Multiple Vitamins-Minerals (CENTRUM SILVER  PO) Take 1 tablet by mouth daily.     olmesartan (BENICAR) 40 MG tablet Take 40 mg by mouth daily.     pantoprazole (PROTONIX) 40 MG tablet Take 40 mg by mouth daily.      Pertinent medications related to GI and procedure were reviewed by me with the patient prior to the procedure   Current Facility-Administered Medications:    0.9 %  sodium chloride  infusion, , Intravenous, Continuous, Quintin Buckle, DO, Last Rate: 20 mL/hr at 11/15/23 1348, Continued from Pre-op at 11/15/23 1348  sodium chloride  20 mL/hr at 11/15/23 1348       Allergies  Allergen Reactions   Pollen Extract Shortness Of Breath and Itching   Allergies were reviewed by me prior to the procedure  Objective   Body mass index is 26.58 kg/m. Vitals:   11/15/23 1314  BP: 121/80  Pulse: (!) 58  Resp: 18  Temp: (!) 96.5 F (35.8 C)  TempSrc: Tympanic  SpO2: 99%  Weight: 99.1 kg  Height: 6\' 4"  (1.93 m)     Physical Exam Vitals and nursing note reviewed.  Constitutional:      General: He is not in acute distress.    Appearance: Normal appearance. He is not ill-appearing, toxic-appearing or diaphoretic.  HENT:     Head: Normocephalic and atraumatic.     Nose: Nose normal.     Mouth/Throat:     Mouth: Mucous membranes are moist.     Pharynx: Oropharynx is clear.  Eyes:     General: No scleral icterus.    Extraocular Movements: Extraocular movements intact.  Cardiovascular:     Rate and Rhythm: Regular rhythm. Bradycardia present.     Heart sounds: Normal heart sounds. No murmur heard.    No friction rub. No gallop.  Pulmonary:     Effort: Pulmonary effort is normal. No respiratory distress.     Breath sounds: Normal breath sounds. No wheezing, rhonchi or rales.  Abdominal:     General: Bowel sounds are normal. There is no distension.     Palpations: Abdomen is soft.     Tenderness: There is no abdominal  tenderness. There is no guarding or rebound.  Musculoskeletal:     Cervical back: Neck supple.     Right lower leg: No edema.     Left lower leg: No edema.  Skin:    General: Skin is warm and dry.     Coloration: Skin is not jaundiced or pale.  Neurological:     General: No focal deficit present.     Mental Status: He is alert and oriented to person, place, and time. Mental status is at baseline.  Psychiatric:        Mood and Affect: Mood normal.        Behavior: Behavior normal.        Thought Content: Thought content normal.        Judgment: Judgment normal.      Assessment:  [name] is a 73 y.o. male  who presents today for Esophagogastroduodenoscopy for gastric intestinal metaplasia surveillance .  Plan:  Esophagogastroduodenoscopy with possible intervention today  Esophagogastroduodenoscopy with possible biopsy, control of bleeding, polypectomy, and interventions as necessary has been discussed with the patient/patient representative. Informed consent was obtained from the patient/patient representative after explaining the indication, nature, and risks of the procedure including but not limited to death, bleeding, perforation, missed neoplasm/lesions, cardiorespiratory compromise, and reaction to medications. Opportunity for questions was given and appropriate answers were provided. Patient/patient representative has verbalized understanding is amenable to undergoing the procedure.   Quintin Buckle, DO  [facility]  Portions of the record may have been created with voice recognition software. Occasional wrong-word or 'sound-a-like' substitutions may have occurred due to the inherent limitations of voice recognition software.  Read the chart carefully and recognize, using context, where substitutions may have occurred.

## 2023-11-15 NOTE — Op Note (Signed)
 [facility] Patient Name: Joe Pena Procedure Date: 11/15/2023 2:00 PM MRN: [id number] Account #: [id number] Date of Birth: [birth year] Admit Type: Outpatient Age: 73 Room: [facility] ENDO ROOM 1 Gender: Male Note Status: Finalized Instrument Name: Upper Endoscope [id number] Procedure:             Upper GI endoscopy Indications:           Intestinal metaplasia Providers:             Bridgett Camps, DO Referring MD:          Huel Madison. Alva Jewels MD, MD (Referring MD) Medicines:             Monitored Anesthesia Care Complications:         No immediate complications. Estimated blood loss:                         Minimal. Procedure:             Pre-Anesthesia Assessment:                        - Prior to the procedure, a History and Physical was                         performed, and patient medications and allergies were                         reviewed. The patient is competent. The risks and                         benefits of the procedure and the sedation options and                         risks were discussed with the patient. All questions                         were answered and informed consent was obtained.                         Patient identification and proposed procedure were                         verified by the physician, the nurse, the anesthetist                         and the technician in the endoscopy suite. Mental                         Status Examination: alert and oriented. Airway                         Examination: normal oropharyngeal airway and neck                         mobility. Respiratory Examination: clear to                         auscultation. CV Examination: RRR, no murmurs, no S3  or S4. Prophylactic Antibiotics: The patient does not                         require prophylactic antibiotics. Prior                         Anticoagulants: The patient has taken no anticoagulant                          or antiplatelet agents. ASA Grade Assessment: III - A                         patient with severe systemic disease. After reviewing                         the risks and benefits, the patient was deemed in                         satisfactory condition to undergo the procedure. The                         anesthesia plan was to use monitored anesthesia care                         (MAC). Immediately prior to administration of                         medications, the patient was re-assessed for adequacy                         to receive sedatives. The heart rate, respiratory                         rate, oxygen saturations, blood pressure, adequacy of                         pulmonary ventilation, and response to care were                         monitored throughout the procedure. The physical                         status of the patient was re-assessed after the                         procedure.                        After obtaining informed consent, the endoscope was                         passed under direct vision. Throughout the procedure,                         the patient's blood pressure, pulse, and oxygen                         saturations were monitored continuously. The Endoscope  was introduced through the mouth, and advanced to the                         second part of duodenum. The upper GI endoscopy was                         accomplished without difficulty. The patient tolerated                         the procedure well. Findings:      Localized mild inflammation characterized by granularity was found in       the duodenal bulb. Estimated blood loss: none.      Localized granular mucosa was found in the gastric body. Biopsies were       taken with a cold forceps for histology. Estimated blood loss was       minimal.      The gastric antrum was normal. Imaging was performed using white light       and narrow band imaging to  visualize the mucosa. Entire stomach       evaluated with narrow band imaging. Biopsies taken from antrum/incisura       and body and placed in separate jars to rule out gastric intestinal       metaplasia Biopsies were taken with a cold forceps for histology.       Estimated blood loss was minimal.      A 4 cm hiatal hernia was present. Estimated blood loss: none.      The Z-line was regular. Estimated blood loss: none.      Esophagogastric landmarks were identified: the gastroesophageal junction       was found at 36 cm from the incisors.      The exam of the esophagus was otherwise normal. Impression:            - Duodenitis.                        - Granular gastric mucosa. Biopsied.                        - Normal antrum. Biopsied.                        - 4 cm hiatal hernia.                        - Z-line regular.                        - Esophagogastric landmarks identified. Recommendation:        - Patient has a contact number available for                         emergencies. The signs and symptoms of potential                         delayed complications were discussed with the patient.                         Return to normal activities tomorrow. Written  discharge instructions were provided to the patient.                        - Discharge patient to home.                        - Resume previous diet.                        - Continue present medications.                        - Await pathology results.                        - Repeat upper endoscopy for surveillance based on                         pathology results.                        - Return to referring physician as previously                         scheduled.                        - The findings and recommendations were discussed with                         the patient.                        - The findings and recommendations were discussed with                         the  patient's family. Procedure Code(s):     --- Professional ---                        [phone], Esophagogastroduodenoscopy, flexible,                         transoral; with biopsy, single or multiple Diagnosis Code(s):     --- Professional ---                        K29.80, Duodenitis without bleeding                        K31.89, Other diseases of stomach and duodenum                        K44.9, Diaphragmatic hernia without obstruction or                         gangrene                        K31.A0, Gastric intestinal metaplasia, unspecified CPT copyright 2022 American Medical Association. All rights reserved. The codes documented in this report are preliminary and upon coder review may  be revised to meet current compliance requirements. Attending Participation:      I personally performed the entire procedure.  Polo Brisk, DO Quintin Buckle DO, DO 11/15/2023 2:26:55 PM This report has been signed electronically. Number of Addenda: 0 Note Initiated On: 11/15/2023 2:00 PM Estimated Blood Loss:  Estimated blood loss was minimal.      [facility]

## 2023-11-15 NOTE — Anesthesia Preprocedure Evaluation (Signed)
 Anesthesia Evaluation  Patient identified by MRN, date of birth, ID band Patient awake    Reviewed: Allergy & Precautions, NPO status , Patient's Chart, lab work & pertinent test results  History of Anesthesia Complications Negative for: history of anesthetic complications  Airway Mallampati: I   Neck ROM: Full    Dental no notable dental hx.    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; essential tremor    GI/Hepatic ,GERD  ,,  Endo/Other  Prediabetes   Renal/GU Renal disease (stage III CKD)     Musculoskeletal negative musculoskeletal ROS (+)    Abdominal   Peds  Hematology negative hematology ROS (+)   Anesthesia Other Findings   Reproductive/Obstetrics                             Anesthesia Physical Anesthesia Plan  ASA: 2  Anesthesia Plan: General   Post-op Pain Management:    Induction: Intravenous  PONV Risk Score and Plan: 2 and Propofol  infusion, TIVA and Treatment may vary due to age or medical condition  Airway Management Planned: Natural Airway  Additional Equipment:   Intra-op Plan:   Post-operative Plan:   Informed Consent: I have reviewed the patients History and Physical, chart, labs and discussed the procedure including the risks, benefits and alternatives for the proposed anesthesia with the patient or authorized representative who has indicated his/her understanding and acceptance.       Plan Discussed with: CRNA  Anesthesia Plan Comments: (LMA/GETA backup discussed.  Patient consented for risks of anesthesia including but not limited to:  - adverse reactions to  medications - damage to eyes, teeth, lips or other oral mucosa - nerve damage due to positioning  - sore throat or hoarseness - damage to heart, brain, nerves, lungs, other parts of body or loss of life  Informed patient about role of CRNA in peri- and intra-operative care.  Patient voiced understanding.)       Anesthesia Quick Evaluation

## 2023-11-16 ENCOUNTER — Encounter: Payer: Self-pay | Admitting: Gastroenterology

## 2023-11-16 LAB — SURGICAL PATHOLOGY

## 2024-01-28 ENCOUNTER — Encounter: Payer: Self-pay | Admitting: Urology

## 2024-02-20 ENCOUNTER — Other Ambulatory Visit

## 2024-02-20 ENCOUNTER — Other Ambulatory Visit: Payer: Self-pay

## 2024-02-20 DIAGNOSIS — R972 Elevated prostate specific antigen [PSA]: Secondary | ICD-10-CM

## 2024-02-21 LAB — PSA: Prostate Specific Ag, Serum: 4.9 ng/mL — ABNORMAL HIGH (ref 0.0–4.0)

## 2024-02-28 ENCOUNTER — Other Ambulatory Visit

## 2024-03-02 NOTE — Progress Notes (Unsigned)
 03/04/2024 9:51 AM   Joe Pena Dec 24, 1950 [id number]  Referring provider: Lenon Layman ORN, MD [address] [facility] [place],  [place] [address]  Urological history: 1. Elevated PSA - PSA (06/2023) 6.2  2. BPH with LU TS - PSA (01/2024) 4.9  Chief Complaint  Patient presents with   Elevated PSA   HPI: Joe Pena is a 73 y.o. man who presents today for further follow up on elevated PSA with his wife, Joe Pena.  He is a former Arts development officer.   Previous records reviewed.  His current PSA is 4.9, prior to that it was 6.2 in January.  In 2023, it was 3.93, 2.0 in 2018 and 3.1 in 2017.  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  He has been having nocturia x 3-4 and a weaker urinary stream for the last several years.  He reports no sensation of incomplete bladder emptying, no urinary frequency, no urinary intermittency, no urinary urgency, and he is not  having to strain to void.    Patient denies any modifying or aggravating factors.  Patient denies any recent UTI's, gross hematuria, dysuria or suprapubic/flank pain.  Patient denies any fevers, chills, nausea or vomiting.    He does not have a family history of PCa, colon cancer, ovarian cancer and/or breast cancer, although he seems to develop other genetic conditions like the retinitis pigmentosa and the aneurysm that was repaired approximately 8 years ago.  PSA  (01/2024) 4.9  Serum creatinine (12/2023) 1.4, eGFR 53  Hemoglobin A1c (12/2023) 6.1  Fluid consumptiom: He drinks cranberry juice.  He and his wife are also wondering if there are any supplements they can take for prostate health.   PMH: Past Medical History:  Diagnosis Date   AAA (abdominal aortic aneurysm) (HCC)    a.) s/p EVAR 06/07/2016: 31 mm x 13 cm Gore excluder prosthesis; 16 mm x 7 cm LEFT iliac extender for initial placment of LEFT contralateral limb; 20 mm x 14 cm LEFT iliac extension limb; 23 mm x 12 cm RIGHT iliac  extenion limb   Anginal pain (HCC)    Colon polyps    GERD (gastroesophageal reflux disease)    Hyperlipidemia    Hypertension    OSA on CPAP    Peripheral vascular disease (HCC)    Retinitis pigmentosa 06/27/1995   Umbilical hernia     Surgical History: Past Surgical History:  Procedure Laterality Date   ACHILLES TENDON SURGERY     COLONOSCOPY     COLONOSCOPY WITH PROPOFOL  N/A 08/13/2017   Procedure: COLONOSCOPY WITH PROPOFOL ;  Surgeon: Gaylyn Gladis PENNER, MD;  Location: [facility] ENDOSCOPY;  Service: Endoscopy;  Laterality: N/A;   COLONOSCOPY WITH PROPOFOL  N/A 05/26/2022   Procedure: COLONOSCOPY WITH PROPOFOL ;  Surgeon: Onita Elspeth Sharper, DO;  Location: [facility] ENDOSCOPY;  Service: Gastroenterology;  Laterality: N/A;   ENDOVASCULAR REPAIR/STENT GRAFT N/A 06/07/2016   Procedure: ENDOVASCULAR REPAIR/STENT GRAFT; Location: [facility]; Surgeon(s): Selinda Gu, MD and Cordella Shawl, MD   ESOPHAGOGASTRODUODENOSCOPY N/A 11/15/2023   Procedure: EGD (ESOPHAGOGASTRODUODENOSCOPY);  Surgeon: Onita Elspeth Sharper, DO;  Location: [facility] ENDOSCOPY;  Service: Gastroenterology;  Laterality: N/A;   ESOPHAGOGASTRODUODENOSCOPY (EGD) WITH PROPOFOL  N/A 05/26/2022   Procedure: ESOPHAGOGASTRODUODENOSCOPY (EGD) WITH PROPOFOL ;  Surgeon: Onita Elspeth Sharper, DO;  Location: [facility] ENDOSCOPY;  Service: Gastroenterology;  Laterality: N/A;   EYE SURGERY     HERNIA REPAIR     INSERTION OF MESH N/A 11/27/2022   Procedure: INSERTION OF MESH;  Surgeon:  Rodolph Romano, MD;  Location: [facility] ORS;  Service: General;  Laterality: N/A;  UMBILICAL   lens repair     PERIPHERAL VASCULAR CATHETERIZATION N/A 06/07/2016   Procedure: Endovascular Repair/Stent Graft;  Surgeon: Cordella KANDICE Shawl, MD;  Location: [facility] INVASIVE CV LAB;  Service: Cardiovascular;  Laterality: N/A;   UMBILICAL HERNIA REPAIR N/A 11/27/2022   Procedure: HERNIA REPAIR UMBILICAL ADULT;  Surgeon: Rodolph Romano, MD;  Location: [facility] ORS;  Service: General;   Laterality: N/A;    Home Medications:  Allergies as of 03/04/2024       Reactions   Pollen Extract Shortness Of Breath, Itching        Medication List        Accurate as of March 04, 2024  9:51 AM. If you have any questions, ask your nurse or doctor.          albuterol 108 (90 Base) MCG/ACT inhaler Commonly known as: VENTOLIN HFA 1-2 puffs Inhalation every 4-6 hours prn cough/wheeze   amLODipine  10 MG tablet Commonly known as: NORVASC  Take 10 mg by mouth daily.   atorvastatin  40 MG tablet Commonly known as: LIPITOR Take 1 tablet by mouth daily.   CENTRUM SILVER  PO Take 1 tablet by mouth daily.   cetirizine 10 MG tablet Commonly known as: ZYRTEC Take by mouth.   EPINEPHrine  0.3 mg/0.3 mL Soaj injection Commonly known as: EPI-PEN Inject 0.3 mg into the muscle.   ipratropium 0.06 % nasal spray Commonly known as: ATROVENT Place into the nose.   metoprolol  succinate 25 MG 24 hr tablet Commonly known as: TOPROL -XL Metoprolol  Succinate   metoprolol  succinate 25 MG 24 hr tablet Commonly known as: TOPROL -XL TAKE 1 TABLET (25 MG TOTAL) BY MOUTH DAILY.   olmesartan 40 MG tablet Commonly known as: BENICAR Take 40 mg by mouth daily.   pantoprazole 20 MG tablet Commonly known as: PROTONIX Pantoprazole Sodium   pantoprazole 40 MG tablet Commonly known as: PROTONIX Take 40 mg by mouth daily.   polyethylene glycol-electrolytes 420 g solution Commonly known as: NuLYTELY Take 4,000 mLs by mouth once.        Allergies:  Allergies  Allergen Reactions   Pollen Extract Shortness Of Breath and Itching    Family History: Family History  Problem Relation Age of Onset   Heart disease Mother    Aortic aneurysm Mother    Hyperlipidemia Father    Prostate cancer Neg Hx    Bladder Cancer Neg Hx    Kidney cancer Neg Hx     Social History:  reports that he has never smoked. He has never used smokeless tobacco. He reports current alcohol use. He reports  that he does not use drugs.  ROS: Pertinent ROS in HPI  Physical Exam: BP 136/87 (BP Location: Left Arm, Patient Position: Sitting, Cuff Size: Normal)   Pulse 65   Ht 6' 4 (1.93 m)   Wt 220 lb (99.8 kg)   BMI 26.78 kg/m   Constitutional:  Well nourished. Alert and oriented, No acute distress. HEENT: [place] AT, moist mucus membranes.  Trachea midline Cardiovascular: No clubbing, cyanosis, or edema. Respiratory: Normal respiratory effort, no increased work of breathing. GU: No CVA tenderness.  No bladder fullness or masses.  Patient with uncircumcised phallus.  Foreskin easily retracted with some vitliigo noted on the glans.  Urethral meatus is patent.  No penile discharge. No penile lesions or rashes. Scrotum without lesions, cysts, rashes and/or edema.  Testicles are located scrotally bilaterally. No masses are appreciated in  the testicles. Left and right epididymis are normal. Rectal: Patient with  normal sphincter tone. Anus and perineum without scarring or rashes. No rectal masses are appreciated. Prostate is approximately 60 + grams, could not palpate the entire gland,  no nodules are appreciated. Seminal vesicles could not be palpated.  Neurologic: Grossly intact, no focal deficits, moving all 4 extremities. Psychiatric: Normal mood and affect.  Laboratory Data: See HPI and EPIC I have reviewed the labs.  See HPI.     Pertinent Imaging: N/A  Assessment & Plan:    1. Elevated PSA -I explained that I felt more reassured with his PSA value in the setting of an enlarged prostate - discussed that a PSA value between 4 and 10 carries a risk of 17-32% of having prostate cancer -We discussed continuing to follow the PSA trend or pursuing a prostate MRI for further investigation into any lesions that may be suspicious for clinically significant prostate cancer and would also give us  the size of his prostate as he may likely have issues with obstructive voiding in the future -He and his  wife would like to take a more aggressive approach as they feel he seems to acquire rare genetic diseases, so we will go ahead and pursue a prostate MRI at this time  2. BPH with LU TS - I discussed that lycopene is a substance that has been studied and shown some benefit when it comes to prostate health and it is found in foods that have cooked tomatoes and since he likes spaghetti that would be an easy addition to his diet   Return in about 3 weeks (around 03/25/2024) for Prostate MRI report .  These notes generated with voice recognition software. I apologize for typographical errors.  CLOTILDA HELON RIGGERS  [facility] [address]  [address] [place], [place] [address] [phone]

## 2024-03-04 ENCOUNTER — Encounter: Payer: Self-pay | Admitting: Urology

## 2024-03-04 ENCOUNTER — Ambulatory Visit (INDEPENDENT_AMBULATORY_CARE_PROVIDER_SITE_OTHER): Admitting: Urology

## 2024-03-04 VITALS — BP 136/87 | HR 65 | Ht 76.0 in | Wt 220.0 lb

## 2024-03-04 DIAGNOSIS — N138 Other obstructive and reflux uropathy: Secondary | ICD-10-CM | POA: Diagnosis not present

## 2024-03-04 DIAGNOSIS — N401 Enlarged prostate with lower urinary tract symptoms: Secondary | ICD-10-CM

## 2024-03-04 DIAGNOSIS — H3552 Pigmentary retinal dystrophy: Secondary | ICD-10-CM | POA: Insufficient documentation

## 2024-03-04 DIAGNOSIS — R972 Elevated prostate specific antigen [PSA]: Secondary | ICD-10-CM | POA: Diagnosis not present

## 2024-03-04 NOTE — Patient Instructions (Addendum)
 We have ordered an MRI and the scheduling department should reach out to you to schedule this appointment.  Sometimes, they have difficulty reaching patients because they are calling from an unknown number and many people have their phones set to ignore unknown numbers.  They do try to leave messages, but sometimes voicemails are not set up or mailboxes are full.  If you have not heard from the scheduling department in a week, please call [phone] to schedule your MRI

## 2024-03-06 ENCOUNTER — Ambulatory Visit: Admitting: Urology

## 2024-03-11 ENCOUNTER — Ambulatory Visit
Admission: RE | Admit: 2024-03-11 | Discharge: 2024-03-11 | Disposition: A | Discharge status: Home / Self Care | Source: Ambulatory Visit | Attending: Urology | Admitting: Urology

## 2024-03-11 DIAGNOSIS — N401 Enlarged prostate with lower urinary tract symptoms: Secondary | ICD-10-CM | POA: Insufficient documentation

## 2024-03-11 DIAGNOSIS — N138 Other obstructive and reflux uropathy: Secondary | ICD-10-CM | POA: Diagnosis present

## 2024-03-11 DIAGNOSIS — R972 Elevated prostate specific antigen [PSA]: Secondary | ICD-10-CM | POA: Diagnosis present

## 2024-03-11 MED ORDER — GADOBUTROL 1 MMOL/ML IV SOLN
10.0000 mL | Freq: Once | INTRAVENOUS | Status: AC | PRN
  Administered 2024-03-11: 10 mL via INTRAVENOUS

## 2024-04-01 ENCOUNTER — Ambulatory Visit (INDEPENDENT_AMBULATORY_CARE_PROVIDER_SITE_OTHER): Payer: Medicare Other | Admitting: Vascular Surgery

## 2024-04-01 ENCOUNTER — Ambulatory Visit (INDEPENDENT_AMBULATORY_CARE_PROVIDER_SITE_OTHER): Payer: Medicare Other

## 2024-04-01 ENCOUNTER — Encounter (INDEPENDENT_AMBULATORY_CARE_PROVIDER_SITE_OTHER): Payer: Self-pay | Admitting: Vascular Surgery

## 2024-04-01 VITALS — BP 131/86 | HR 60 | Resp 18 | Wt 219.0 lb

## 2024-04-01 DIAGNOSIS — E7800 Pure hypercholesterolemia, unspecified: Secondary | ICD-10-CM

## 2024-04-01 DIAGNOSIS — I7143 Infrarenal abdominal aortic aneurysm, without rupture: Secondary | ICD-10-CM

## 2024-04-01 DIAGNOSIS — I10 Essential (primary) hypertension: Secondary | ICD-10-CM | POA: Diagnosis not present

## 2024-04-01 NOTE — Assessment & Plan Note (Signed)
 His duplex today shows a stable 5.05 cm aneurysm sac after previous stent graft repair which has not changed from his study 1 year ago.  Stent graft is patent and there is no evidence of endoleak.  He is doing well from an aneurysm standpoint.  We will continue to follow this on an annual basis with duplex.  No changes in medications.

## 2024-04-01 NOTE — Progress Notes (Signed)
 MRN : [id number]  Joe Pena is a 73 y.o. ([birth year]) male who presents with chief complaint of  Chief Complaint  Patient presents with   Follow-up    63yr EVAR follow up  .  History of Present Illness: Patient returns today in follow up of his abdominal aortic aneurysm.  He is almost 8 years status post endovascular repair of his abdominal aortic aneurysm.  He is doing well.  He denies any aneurysm related symptoms. Specifically, the patient denies new back or abdominal pain, or signs of peripheral embolization. He has lost his visual acuity but he has a wonderful wife who takes care of him and helps tremendously.  His duplex today shows a stable 5.05 cm aneurysm sac after previous stent graft repair which has not changed from his study 1 year ago.  Stent graft is patent and there is no evidence of endoleak.  Current Outpatient Medications  Medication Sig Dispense Refill   albuterol (VENTOLIN HFA) 108 (90 Base) MCG/ACT inhaler 1-2 puffs Inhalation every 4-6 hours prn cough/wheeze     amLODipine  (NORVASC ) 10 MG tablet Take 10 mg by mouth daily.     atorvastatin  (LIPITOR) 40 MG tablet Take 1 tablet by mouth daily.     cetirizine (ZYRTEC) 10 MG tablet Take by mouth.     EPINEPHrine  0.3 mg/0.3 mL IJ SOAJ injection Inject 0.3 mg into the muscle.     ipratropium (ATROVENT) 0.06 % nasal spray Place into the nose.     metoprolol  succinate (TOPROL -XL) 25 MG 24 hr tablet TAKE 1 TABLET (25 MG TOTAL) BY MOUTH DAILY. 15 tablet 0   metoprolol  succinate (TOPROL -XL) 25 MG 24 hr tablet Metoprolol  Succinate     Multiple Vitamins-Minerals (CENTRUM SILVER  PO) Take 1 tablet by mouth daily.     olmesartan (BENICAR) 40 MG tablet Take 40 mg by mouth daily.     pantoprazole (PROTONIX) 20 MG tablet Pantoprazole Sodium     pantoprazole (PROTONIX) 40 MG tablet Take 40 mg by mouth daily.     polyethylene glycol-electrolytes (NULYTELY) 420 g solution Take 4,000 mLs by mouth once. (Patient not taking: Reported  on 04/01/2024)     No current facility-administered medications for this visit.    Past Medical History:  Diagnosis Date   AAA (abdominal aortic aneurysm)    a.) s/p EVAR 06/07/2016: 31 mm x 13 cm Gore excluder prosthesis; 16 mm x 7 cm LEFT iliac extender for initial placment of LEFT contralateral limb; 20 mm x 14 cm LEFT iliac extension limb; 23 mm x 12 cm RIGHT iliac extenion limb   Anginal pain    Colon polyps    GERD (gastroesophageal reflux disease)    Hyperlipidemia    Hypertension    OSA on CPAP    Peripheral vascular disease    Retinitis pigmentosa 06/27/1995   Umbilical hernia     Past Surgical History:  Procedure Laterality Date   ACHILLES TENDON SURGERY     COLONOSCOPY     COLONOSCOPY WITH PROPOFOL  N/A 08/13/2017   Procedure: COLONOSCOPY WITH PROPOFOL ;  Surgeon: Gaylyn Gladis PENNER, MD;  Location: [facility] ENDOSCOPY;  Service: Endoscopy;  Laterality: N/A;   COLONOSCOPY WITH PROPOFOL  N/A 05/26/2022   Procedure: COLONOSCOPY WITH PROPOFOL ;  Surgeon: Onita Elspeth Sharper, DO;  Location: [facility] ENDOSCOPY;  Service: Gastroenterology;  Laterality: N/A;   ENDOVASCULAR STENT GRAFT (AAA) N/A 06/07/2016   Procedure: ENDOVASCULAR REPAIR/STENT GRAFT; Location: [facility]; Surgeon(s): Selinda Gu, MD and Cordella Shawl, MD   ESOPHAGOGASTRODUODENOSCOPY N/A 11/15/2023  Procedure: EGD (ESOPHAGOGASTRODUODENOSCOPY);  Surgeon: Onita Elspeth Sharper, DO;  Location: [facility] ENDOSCOPY;  Service: Gastroenterology;  Laterality: N/A;   ESOPHAGOGASTRODUODENOSCOPY (EGD) WITH PROPOFOL  N/A 05/26/2022   Procedure: ESOPHAGOGASTRODUODENOSCOPY (EGD) WITH PROPOFOL ;  Surgeon: Onita Elspeth Sharper, DO;  Location: [facility] ENDOSCOPY;  Service: Gastroenterology;  Laterality: N/A;   EYE SURGERY     HERNIA REPAIR     INSERTION OF MESH N/A 11/27/2022   Procedure: INSERTION OF MESH;  Surgeon: Rodolph Romano, MD;  Location: [facility] ORS;  Service: General;  Laterality: N/A;  UMBILICAL   lens repair     PERIPHERAL VASCULAR  CATHETERIZATION N/A 06/07/2016   Procedure: Endovascular Repair/Stent Graft;  Surgeon: Cordella KANDICE Shawl, MD;  Location: [facility] INVASIVE CV LAB;  Service: Cardiovascular;  Laterality: N/A;   UMBILICAL HERNIA REPAIR N/A 11/27/2022   Procedure: HERNIA REPAIR UMBILICAL ADULT;  Surgeon: Rodolph Romano, MD;  Location: [facility] ORS;  Service: General;  Laterality: N/A;     Social History   Tobacco Use   Smoking status: Never   Smokeless tobacco: Never  Vaping Use   Vaping status: Never Used  Substance Use Topics   Alcohol use: Yes    Comment: 2 times weekly   Drug use: No      Family History  Problem Relation Age of Onset   Heart disease Mother    Aortic aneurysm Mother    Hyperlipidemia Father    Prostate cancer Neg Hx    Bladder Cancer Neg Hx    Kidney cancer Neg Hx      Allergies  Allergen Reactions   Pollen Extract Shortness Of Breath and Itching     REVIEW OF SYSTEMS (Negative unless checked)  Constitutional: [] Weight loss  [] Fever  [] Chills Cardiac: [] Chest pain   [] Chest pressure   [] Palpitations   [] Shortness of breath when laying flat   [] Shortness of breath at rest   [] Shortness of breath with exertion. Vascular:  [] Pain in legs with walking   [] Pain in legs at rest   [] Pain in legs when laying flat   [] Claudication   [] Pain in feet when walking  [] Pain in feet at rest  [] Pain in feet when laying flat   [] History of DVT   [] Phlebitis   [] Swelling in legs   [] Varicose veins   [] Non-healing ulcers Pulmonary:   [] Uses home oxygen   [] Productive cough   [] Hemoptysis   [] Wheeze  [] COPD   [] Asthma Neurologic:  [] Dizziness  [] Blackouts   [] Seizures   [] History of stroke   [] History of TIA  [] Aphasia   [] Temporary blindness   [] Dysphagia   [] Weakness or numbness in arms   [] Weakness or numbness in legs Musculoskeletal:  [x] Arthritis   [] Joint swelling   [] Joint pain   [] Low back pain Hematologic:  [] Easy bruising  [] Easy bleeding   [] Hypercoagulable state   [] Anemic    Gastrointestinal:  [] Blood in stool   [] Vomiting blood  [] Gastroesophageal reflux/heartburn   [x] Abdominal pain Genitourinary:  [] Chronic kidney disease   [] Difficult urination  [] Frequent urination  [] Burning with urination   [] Hematuria Skin:  [] Rashes   [] Ulcers   [] Wounds Psychological:  [] History of anxiety   []  History of major depression.  Physical Examination  BP 131/86   Pulse 60   Resp 18   Wt 219 lb (99.3 kg)   BMI 26.66 kg/m  Gen:  WD/WN, NAD Head: [place]/AT, No temporalis wasting. Ear/Nose/Throat: Hearing grossly intact, nares w/o erythema or drainage Eyes: Conjunctiva clear. Visual acuity lacking Neck: Supple.  Trachea midline  Pulmonary:  Good air movement, no use of accessory muscles.  Cardiac: RRR, no JVD Vascular:  Vessel Right Left  Radial Palpable Palpable                                   Gastrointestinal: soft, non-tender/non-distended. No guarding/reflex. Aorta not palpable Musculoskeletal: M/S 5/5 throughout.  No deformity or atrophy. No significant LE edema. Neurologic: Sensation grossly intact in extremities.  Symmetrical.  Speech is fluent.  Psychiatric: Judgment intact, Mood & affect appropriate for pt's clinical situation. Dermatologic: No rashes or ulcers noted.  No cellulitis or open wounds.      Labs Recent Results (from the past 2160 hours)  PSA     Status: Abnormal   Collection Time: 02/20/24  3:20 PM  Result Value Ref Range   Prostate Specific Ag, Serum 4.9 (H) 0.0 - 4.0 ng/mL    Comment: Roche ECLIA methodology. According to the American Urological Association, Serum PSA should decrease and remain at undetectable levels after radical prostatectomy. The AUA defines biochemical recurrence as an initial PSA value 0.2 ng/mL or greater followed by a subsequent confirmatory PSA value 0.2 ng/mL or greater. Values obtained with different assay methods or kits cannot be used interchangeably. Results cannot be interpreted as absolute  evidence of the presence or absence of malignant disease.     Radiology MR PROSTATE W WO CONTRAST Result Date: 03/13/2024 CLINICAL DATA:  Elevated PSA. EXAM: MR PROSTATE WITHOUT AND WITH CONTRAST TECHNIQUE: Multiplanar multisequence MRI images were obtained of the pelvis centered about the prostate. Pre and post contrast images were obtained. CONTRAST:  10mL GADAVIST  GADOBUTROL  1 MMOL/ML IV SOLN COMPARISON:  None Available. FINDINGS: Prostate: -- Peripheral Zone: 1.3 cm extruded BPH nodule noted in the right posterolateral base. Linear/wedge shaped hypointensities are noted on ADC; however, no focal ADC hypointense or high b-value DWI hyperintense nodules are identified. -- Transition/Central Zone: Mildly enlarged with diffuse involvement by BPH nodules. No suspicious appearing lesions identified. -- Measurements:  5.4 by 3.8 x 5.0 cm Transcapsular spread:  Absent Seminal vesicle involvement:  Absent Neurovascular bundle involvement:  Absent Pelvic adenopathy: None visualized Bone metastasis: None visualized Other findings: Mild diffuse bladder wall thickening, consistent with chronic bladder outlet obstruction. IMPRESSION: No radiographic evidence of high-grade prostate carcinoma. PI-RADS 2 (V. 2.1): Low (clinically significant cancer unlikely). Electronically Signed   By: Norleen DELENA Kil M.D.   On: 03/13/2024 10:15    Assessment/Plan  AAA (abdominal aortic aneurysm) without rupture His duplex today shows a stable 5.05 cm aneurysm sac after previous stent graft repair which has not changed from his study 1 year ago.  Stent graft is patent and there is no evidence of endoleak.  He is doing well from an aneurysm standpoint.  We will continue to follow this on an annual basis with duplex.  No changes in medications.  Essential hypertension blood pressure control important in reducing the progression of atherosclerotic disease and aneurysmal growth. On appropriate oral medications.   Pure  hypercholesterolemia lipid control important in reducing the progression of atherosclerotic disease. Continue statin therapy  Selinda Gu, MD  04/01/2024 10:24 AM    This note was created with Dragon medical transcription system.  Any errors from dictation are purely unintentional

## 2024-04-10 ENCOUNTER — Ambulatory Visit (INDEPENDENT_AMBULATORY_CARE_PROVIDER_SITE_OTHER): Admitting: Urology

## 2024-04-10 ENCOUNTER — Encounter: Payer: Self-pay | Admitting: Urology

## 2024-04-10 VITALS — BP 137/80 | HR 61 | Ht 76.0 in | Wt 219.0 lb

## 2024-04-10 DIAGNOSIS — N138 Other obstructive and reflux uropathy: Secondary | ICD-10-CM

## 2024-04-10 DIAGNOSIS — R972 Elevated prostate specific antigen [PSA]: Secondary | ICD-10-CM | POA: Diagnosis not present

## 2024-04-10 DIAGNOSIS — N401 Enlarged prostate with lower urinary tract symptoms: Secondary | ICD-10-CM

## 2024-04-10 NOTE — Progress Notes (Signed)
 04/10/2024 10:45 AM   Joe Pena 05-Oct-1950 [id number]  Referring provider: Lenon Layman ORN, MD [address] [place] [facility] [place],  [place] [address]  Urological history: 1. Elevated PSA - PSA (06/2023) 6.2  2. BPH with LU TS - PSA (01/2024) 4.9  Chief Complaint  Patient presents with   Follow-up   Results   HPI: Joe Pena is a 73 y.o. man who presents today for prostate MRI report with his wife, [name].  He is a former [employer] development officer.   Previous records reviewed.  Prostate MRI (02/2024) prostate volume about 60 cc, PSA density of 0.81.  No radiographic evidence of high-grade prostatic carcinoma.  He is doing well.  He has no bothersome urinary symptoms.  He has nocturia x 2 nightly.  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   Umbilical hernia     Surgical History: Past Surgical History:  Procedure Laterality Date   ACHILLES TENDON SURGERY     COLONOSCOPY     COLONOSCOPY WITH PROPOFOL  N/A 08/13/2017   Procedure: COLONOSCOPY WITH PROPOFOL ;  Surgeon: Gaylyn Gladis PENNER, MD;  Location: [facility] ENDOSCOPY;  Service: Endoscopy;  Laterality: N/A;   COLONOSCOPY WITH PROPOFOL  N/A 05/26/2022   Procedure: COLONOSCOPY WITH PROPOFOL ;  Surgeon: Onita Elspeth Sharper, DO;  Location: [facility] ENDOSCOPY;  Service: Gastroenterology;  Laterality: N/A;   ENDOVASCULAR STENT GRAFT (AAA) N/A 06/07/2016   Procedure: ENDOVASCULAR REPAIR/STENT GRAFT; Location: [facility]; Surgeon(s): Selinda Gu, MD and Cordella Shawl, MD    ESOPHAGOGASTRODUODENOSCOPY N/A 11/15/2023   Procedure: EGD (ESOPHAGOGASTRODUODENOSCOPY);  Surgeon: Onita Elspeth Sharper, DO;  Location: [facility] ENDOSCOPY;  Service: Gastroenterology;  Laterality: N/A;   ESOPHAGOGASTRODUODENOSCOPY (EGD) WITH PROPOFOL  N/A 05/26/2022   Procedure: ESOPHAGOGASTRODUODENOSCOPY (EGD) WITH PROPOFOL ;  Surgeon: Onita Elspeth Sharper, DO;  Location: [facility] ENDOSCOPY;  Service: Gastroenterology;  Laterality: N/A;   EYE SURGERY     HERNIA REPAIR     INSERTION OF MESH N/A 11/27/2022   Procedure: INSERTION OF MESH;  Surgeon: Rodolph Romano, MD;  Location: [facility] ORS;  Service: General;  Laterality: N/A;  UMBILICAL   lens repair     PERIPHERAL VASCULAR CATHETERIZATION N/A 06/07/2016   Procedure: Endovascular Repair/Stent Graft;  Surgeon: Cordella KANDICE Shawl, MD;  Location: [facility] INVASIVE CV LAB;  Service: Cardiovascular;  Laterality: N/A;   UMBILICAL HERNIA REPAIR N/A 11/27/2022   Procedure: HERNIA REPAIR UMBILICAL ADULT;  Surgeon: Rodolph Romano, MD;  Location: [facility] ORS;  Service: General;  Laterality: N/A;    Home Medications:  Allergies as of 04/10/2024       Reactions   Pollen Extract Shortness Of Breath, Itching        Medication List        Accurate as of April 10, 2024 10:45 AM. If you have any questions, ask your nurse or doctor.          albuterol 108 (90 Base) MCG/ACT inhaler Commonly known as: VENTOLIN HFA 1-2 puffs  Inhalation every 4-6 hours prn cough/wheeze   amLODipine  10 MG tablet Commonly known as: NORVASC  Take 10 mg by mouth daily.   atorvastatin  40 MG tablet Commonly known as: LIPITOR Take 1 tablet by mouth daily.   CENTRUM SILVER  PO Take 1 tablet by mouth daily.   cetirizine 10 MG tablet Commonly known as: ZYRTEC Take by mouth.   EPINEPHrine  0.3 mg/0.3 mL Soaj injection Commonly known as: EPI-PEN Inject 0.3 mg into the muscle.   ipratropium 0.06 % nasal spray Commonly known as: ATROVENT Place into the nose.    metoprolol  succinate 25 MG 24 hr tablet Commonly known as: TOPROL -XL Metoprolol  Succinate   metoprolol  succinate 25 MG 24 hr tablet Commonly known as: TOPROL -XL TAKE 1 TABLET (25 MG TOTAL) BY MOUTH DAILY.   olmesartan 40 MG tablet Commonly known as: BENICAR Take 40 mg by mouth daily.   pantoprazole 20 MG tablet Commonly known as: PROTONIX Pantoprazole Sodium   pantoprazole 40 MG tablet Commonly known as: PROTONIX Take 40 mg by mouth daily.   polyethylene glycol-electrolytes 420 g solution Commonly known as: NuLYTELY Take 4,000 mLs by mouth once.        Allergies:  Allergies  Allergen Reactions   Pollen Extract Shortness Of Breath and Itching    Family History: Family History  Problem Relation Age of Onset   Heart disease Mother    Aortic aneurysm Mother    Hyperlipidemia Father    Prostate cancer Neg Hx    Bladder Cancer Neg Hx    Kidney cancer Neg Hx     Social History:  reports that he has never smoked. He has never used smokeless tobacco. He reports current alcohol use. He reports that he does not use drugs.  ROS: Pertinent ROS in HPI  Physical Exam: BP 137/80   Pulse 61   Ht 6' 4 (1.93 m)   Wt 219 lb (99.3 kg)   SpO2 97%   BMI 26.66 kg/m   Constitutional:  Well nourished. Alert and oriented, No acute distress. HEENT: [place] AT, moist mucus membranes.  Trachea midline Cardiovascular: No clubbing, cyanosis, or edema. Respiratory: Normal respiratory effort, no increased work of breathing. Neurologic: Grossly intact, no focal deficits, moving all 4 extremities. Psychiatric: Normal mood and affect.    Laboratory Data: See HPI and EPIC I have reviewed the labs.  See HPI.     Pertinent Imaging: CLINICAL DATA:  Elevated PSA.   EXAM: MR PROSTATE WITHOUT AND WITH CONTRAST   TECHNIQUE: Multiplanar multisequence MRI images were obtained of the pelvis centered about the prostate. Pre and post contrast images were obtained.   CONTRAST:  10mL  GADAVIST  GADOBUTROL  1 MMOL/ML IV SOLN   COMPARISON:  None Available.   FINDINGS: Prostate:   -- Peripheral Zone: 1.3 cm extruded BPH nodule noted in the right posterolateral base. Linear/wedge shaped hypointensities are noted on ADC; however, no focal ADC hypointense or high b-value DWI hyperintense nodules are identified.   -- Transition/Central Zone: Mildly enlarged with diffuse involvement by BPH nodules. No suspicious appearing lesions identified.   -- Measurements:  5.4 by 3.8 x 5.0 cm   Transcapsular spread:  Absent   Seminal vesicle involvement:  Absent   Neurovascular bundle involvement:  Absent   Pelvic adenopathy: None visualized   Bone metastasis: None visualized   Other findings: Mild diffuse bladder wall thickening, consistent with chronic bladder outlet obstruction.   IMPRESSION: No radiographic evidence of high-grade prostate carcinoma. PI-RADS 2 (V. 2.1): Low (clinically significant cancer unlikely).  Electronically Signed   By: Norleen DELENA Kil M.D.   On: 03/13/2024 10:15 I have independently reviewed the films.  See HPI.    Assessment & Plan:    1. Elevated PSA - Recent prostate MRI did not find any evidence of high-grade prostate carcinoma - PSA density is less than 0.1 - Both these findings are reassuring - We will continue to monitor his PSA twice yearly  2. BPH with LU TS - Continue conservative management  Return in about 6 months (around 10/09/2024) for free and total PSA .  These notes generated with voice recognition software. I apologize for typographical errors.  CLOTILDA HELON RIGGERS  [facility] [address]  [address] [place], [place] [address] [phone]

## 2024-04-17 ENCOUNTER — Ambulatory Visit: Admitting: Urology

## 2024-10-10 ENCOUNTER — Other Ambulatory Visit

## 2025-03-31 ENCOUNTER — Other Ambulatory Visit (INDEPENDENT_AMBULATORY_CARE_PROVIDER_SITE_OTHER)

## 2025-03-31 ENCOUNTER — Ambulatory Visit (INDEPENDENT_AMBULATORY_CARE_PROVIDER_SITE_OTHER): Admitting: Vascular Surgery
